# Patient Record
Sex: Female | Born: 1965 | Race: Black or African American | Hispanic: No | Smoking: Former smoker
Health system: Southern US, Community
[De-identification: ages and names within clinical notes are randomized; demographics above are authoritative.]

## PROBLEM LIST (undated history)

## (undated) DIAGNOSIS — K5140 Inflammatory polyps of colon without complications: Secondary | ICD-10-CM

## (undated) DIAGNOSIS — K449 Diaphragmatic hernia without obstruction or gangrene: Secondary | ICD-10-CM

## (undated) DIAGNOSIS — J189 Pneumonia, unspecified organism: Secondary | ICD-10-CM

## (undated) DIAGNOSIS — N390 Urinary tract infection, site not specified: Secondary | ICD-10-CM

## (undated) DIAGNOSIS — M1990 Unspecified osteoarthritis, unspecified site: Secondary | ICD-10-CM

## (undated) DIAGNOSIS — R51 Headache: Secondary | ICD-10-CM

## (undated) DIAGNOSIS — F329 Major depressive disorder, single episode, unspecified: Secondary | ICD-10-CM

## (undated) DIAGNOSIS — K219 Gastro-esophageal reflux disease without esophagitis: Secondary | ICD-10-CM

## (undated) DIAGNOSIS — G4730 Sleep apnea, unspecified: Secondary | ICD-10-CM

## (undated) DIAGNOSIS — K222 Esophageal obstruction: Secondary | ICD-10-CM

## (undated) DIAGNOSIS — F32A Depression, unspecified: Secondary | ICD-10-CM

## (undated) DIAGNOSIS — F419 Anxiety disorder, unspecified: Secondary | ICD-10-CM

## (undated) DIAGNOSIS — I10 Essential (primary) hypertension: Secondary | ICD-10-CM

## (undated) HISTORY — PX: WISDOM TOOTH EXTRACTION: SHX21

## (undated) HISTORY — PX: GASTRIC BYPASS: SHX52

## (undated) HISTORY — PX: LAPAROSCOPY: SHX197

## (undated) HISTORY — DX: Unspecified osteoarthritis, unspecified site: M19.90

## (undated) HISTORY — PX: OVARIAN CYST REMOVAL: SHX89

## (undated) HISTORY — PX: TONSILLECTOMY: SUR1361

## (undated) HISTORY — PX: OTHER SURGICAL HISTORY: SHX169

## (undated) HISTORY — PX: KNEE SURGERY: SHX244

## (undated) HISTORY — PX: CHOLECYSTECTOMY: SHX55

## (undated) HISTORY — PX: APPENDECTOMY: SHX54

## (undated) HISTORY — PX: ECTOPIC PREGNANCY SURGERY: SHX613

---

## 2002-12-06 ENCOUNTER — Inpatient Hospital Stay (HOSPITAL_COMMUNITY): Admission: AD | Admit: 2002-12-06 | Discharge: 2002-12-06 | Payer: Self-pay | Admitting: Family Medicine

## 2002-12-14 ENCOUNTER — Inpatient Hospital Stay (HOSPITAL_COMMUNITY): Admission: AD | Admit: 2002-12-14 | Discharge: 2002-12-14 | Payer: Self-pay | Admitting: *Deleted

## 2002-12-18 ENCOUNTER — Encounter: Payer: Self-pay | Admitting: *Deleted

## 2002-12-18 ENCOUNTER — Inpatient Hospital Stay (HOSPITAL_COMMUNITY): Admission: AD | Admit: 2002-12-18 | Discharge: 2002-12-18 | Payer: Self-pay | Admitting: *Deleted

## 2002-12-21 ENCOUNTER — Inpatient Hospital Stay (HOSPITAL_COMMUNITY): Admission: AD | Admit: 2002-12-21 | Discharge: 2002-12-23 | Payer: Self-pay | Admitting: *Deleted

## 2010-07-26 ENCOUNTER — Emergency Department (HOSPITAL_COMMUNITY): Admission: EM | Admit: 2010-07-26 | Discharge: 2010-07-26 | Payer: Self-pay | Admitting: Emergency Medicine

## 2010-09-28 ENCOUNTER — Emergency Department (HOSPITAL_COMMUNITY)
Admission: EM | Admit: 2010-09-28 | Discharge: 2010-09-28 | Payer: Self-pay | Source: Home / Self Care | Admitting: Emergency Medicine

## 2010-09-28 LAB — CBC
HCT: 41.4 % (ref 36.0–46.0)
Hemoglobin: 13.6 g/dL (ref 12.0–15.0)
MCH: 29.9 pg (ref 26.0–34.0)
MCHC: 32.9 g/dL (ref 30.0–36.0)
MCV: 91 fL (ref 78.0–100.0)
Platelets: 387 10*3/uL (ref 150–400)
RBC: 4.55 MIL/uL (ref 3.87–5.11)
RDW: 13.1 % (ref 11.5–15.5)
WBC: 5.5 10*3/uL (ref 4.0–10.5)

## 2010-09-28 LAB — COMPREHENSIVE METABOLIC PANEL
ALT: 27 U/L (ref 0–35)
AST: 30 U/L (ref 0–37)
Albumin: 4.3 g/dL (ref 3.5–5.2)
Alkaline Phosphatase: 71 U/L (ref 39–117)
BUN: 15 mg/dL (ref 6–23)
CO2: 28 mEq/L (ref 19–32)
Calcium: 9.5 mg/dL (ref 8.4–10.5)
Chloride: 97 mEq/L (ref 96–112)
Creatinine, Ser: 1.33 mg/dL — ABNORMAL HIGH (ref 0.4–1.2)
GFR calc Af Amer: 53 mL/min — ABNORMAL LOW (ref >60)
GFR calc non Af Amer: 44 mL/min — ABNORMAL LOW (ref >60)
Glucose, Bld: 96 mg/dL (ref 70–99)
Potassium: 4 mEq/L (ref 3.5–5.1)
Sodium: 135 mEq/L (ref 135–145)
Total Bilirubin: 0.3 mg/dL (ref 0.3–1.2)
Total Protein: 8.4 g/dL — ABNORMAL HIGH (ref 6.0–8.3)

## 2010-09-28 LAB — RPR: RPR Ser Ql: NONREACTIVE

## 2010-09-28 LAB — URINALYSIS, ROUTINE W REFLEX MICROSCOPIC
Bilirubin Urine: NEGATIVE
Ketones, ur: NEGATIVE mg/dL
Leukocytes, UA: NEGATIVE
Nitrite: NEGATIVE
Protein, ur: NEGATIVE mg/dL
Specific Gravity, Urine: 1.017 (ref 1.005–1.030)
Urine Glucose, Fasting: NEGATIVE mg/dL
Urobilinogen, UA: 0.2 mg/dL (ref 0.0–1.0)
pH: 6 (ref 5.0–8.0)

## 2010-09-28 LAB — WET PREP, GENITAL: Yeast Wet Prep HPF POC: NONE SEEN

## 2010-09-28 LAB — URINE MICROSCOPIC-ADD ON

## 2010-09-28 LAB — POCT PREGNANCY, URINE: Preg Test, Ur: NEGATIVE

## 2010-09-28 LAB — LIPASE, BLOOD: Lipase: 28 U/L (ref 11–59)

## 2010-09-29 LAB — GC/CHLAMYDIA PROBE AMP, GENITAL
Chlamydia, DNA Probe: NEGATIVE
GC Probe Amp, Genital: NEGATIVE

## 2010-10-05 ENCOUNTER — Encounter: Payer: Self-pay | Admitting: Obstetrics and Gynecology

## 2010-10-05 ENCOUNTER — Other Ambulatory Visit: Payer: Self-pay | Admitting: Obstetrics and Gynecology

## 2010-10-05 DIAGNOSIS — N949 Unspecified condition associated with female genital organs and menstrual cycle: Secondary | ICD-10-CM

## 2010-10-05 DIAGNOSIS — K469 Unspecified abdominal hernia without obstruction or gangrene: Secondary | ICD-10-CM

## 2010-10-05 DIAGNOSIS — N898 Other specified noninflammatory disorders of vagina: Secondary | ICD-10-CM

## 2010-10-05 LAB — CONVERTED CEMR LAB
Trich, Wet Prep: NONE SEEN
Yeast Wet Prep HPF POC: NONE SEEN

## 2010-10-10 ENCOUNTER — Inpatient Hospital Stay (HOSPITAL_COMMUNITY)
Admission: RE | Admit: 2010-10-10 | Discharge: 2010-10-10 | Payer: Self-pay | Source: Ambulatory Visit | Attending: Obstetrics and Gynecology | Admitting: Obstetrics and Gynecology

## 2010-10-13 ENCOUNTER — Ambulatory Visit (HOSPITAL_COMMUNITY)
Admission: RE | Admit: 2010-10-13 | Discharge: 2010-10-13 | Disposition: A | Discharge status: Home / Self Care | Payer: Medicaid Other | Source: Ambulatory Visit | Attending: Obstetrics and Gynecology | Admitting: Obstetrics and Gynecology

## 2010-10-13 DIAGNOSIS — R1031 Right lower quadrant pain: Secondary | ICD-10-CM | POA: Insufficient documentation

## 2010-10-13 DIAGNOSIS — K5730 Diverticulosis of large intestine without perforation or abscess without bleeding: Secondary | ICD-10-CM | POA: Insufficient documentation

## 2010-10-13 DIAGNOSIS — D259 Leiomyoma of uterus, unspecified: Secondary | ICD-10-CM | POA: Insufficient documentation

## 2010-10-13 DIAGNOSIS — K469 Unspecified abdominal hernia without obstruction or gangrene: Secondary | ICD-10-CM

## 2010-10-13 DIAGNOSIS — Q619 Cystic kidney disease, unspecified: Secondary | ICD-10-CM | POA: Insufficient documentation

## 2010-10-13 MED ORDER — IOHEXOL 300 MG/ML  SOLN
100.0000 mL | Freq: Once | INTRAMUSCULAR | Status: AC | PRN
  Administered 2010-10-13: 100 mL via INTRAVENOUS

## 2010-10-21 ENCOUNTER — Other Ambulatory Visit: Payer: Self-pay | Admitting: Obstetrics & Gynecology

## 2010-10-21 ENCOUNTER — Ambulatory Visit (INDEPENDENT_AMBULATORY_CARE_PROVIDER_SITE_OTHER): Payer: Medicaid Other | Admitting: Obstetrics & Gynecology

## 2010-10-21 DIAGNOSIS — N949 Unspecified condition associated with female genital organs and menstrual cycle: Secondary | ICD-10-CM

## 2010-10-21 DIAGNOSIS — Z1231 Encounter for screening mammogram for malignant neoplasm of breast: Secondary | ICD-10-CM

## 2010-10-21 DIAGNOSIS — R1084 Generalized abdominal pain: Secondary | ICD-10-CM

## 2010-10-21 DIAGNOSIS — R1031 Right lower quadrant pain: Secondary | ICD-10-CM

## 2010-10-21 DIAGNOSIS — R102 Pelvic and perineal pain: Secondary | ICD-10-CM

## 2010-10-22 NOTE — Progress Notes (Signed)
NAME:  PRICELLA, Bailey Russell NO.:  [id number]  MEDICAL RECORD NO.:  [id number]           PATIENT TYPE:  A  LOCATION:  [facility]                   FACILITY:  [facility]  PHYSICIAN:  Allie Bossier, MD        DATE OF BIRTH:  [birth year]  DATE OF SERVICE:  10/21/2010                                 CLINIC NOTE  [name] is a 45 year old widowed black G4, P4.  She was seen in the MAU on September 28, 2010, complaining of right lower quadrant pain.  She describes it as mild-to-moderate.  She denied associated constipation, diarrhea, flank pain, hematemesis, hematuria, etc.  Please note visit at that time, she was noted to have trichomoniasis and was treated.  She followed up in the [facility]YN Clinic and was seen here by Dr. Okey Dupre on October 05, 2010.  At that time, he ordered the CT to see if there is any other cause of her pain that can be seen on CT.  The CT was essentially normal and did show the same fibroid that has been present for years (fibroid is posterior and 6 cm).  PAST MEDICAL HISTORY:  Morbid obesity, migraines, hypertension, depression, anxiety, recurrent Trichomonas and BV.  REVIEW OF SYSTEMS:  She says her periods are monthly, they last 4-6 days per month.  They are rarely painful.  She has been abstinent since Russell 2011 (please note her Trichomonas keeps coming back positive in spite of treatment).  Her Pap smear was negative in March 2011, and she has not yet had a mammogram.  PAST SURGICAL HISTORY:  She has had a laparoscopy for lysis of adhesions.  Prior to that, she had an exploratory laparotomy for right ectopic pregnancy and a portion of her right tube was removed.  Other surgeries include cholecystectomy, appendectomy, right ovarian cystectomy.  FAMILY HISTORY:  Negative for breast, GYN, and colon malignancies.  SOCIAL HISTORY:  Negative for tobacco, alcohol, or drug use.  No latex allergies.  No known drug allergies.  MEDICATIONS: 1. Topamax  daily. 2. Phentermine for weight loss daily. 3. Hydrochlorothiazide daily. 4. Diclofenac b.i.d. 5. Citalopram daily. 6. Clonazepam on a p.r.n. basis. 7. Tylenol PM on a p.r.n. basis. 8. She is currently taking the metronidazole for BV.  PHYSICAL EXAMINATION:  GENERAL:  Well-nourished, morbidly obese black female. VITAL SIGNS:  Height 5 feet 7 inches, weight 294 pounds, blood pressure 110/78, pulse 87. HEART:  Regular rate rhythm. LUNGS:  Clear to auscultation bilaterally. ABDOMEN:  Obese, benign. GU:  External genitalia, no lesions.  Bimanual, she has reasonably good pubic arch.  She has a 6-8 week size anteverted mobile uterus, it is nontender.  I do not feel any adnexal masses.  ASSESSMENT AND PLAN:  Right lower quadrant pain for several months' duration.  I suggested watchful waiting, but she is adamant that the pain is so disruptive to her life that she would like another surgery. I have explained the surgeries cause adhesions or can be a cause of adhesions and their risks associated with surgery including but not limited to infection, bleeding, damage to bowel, bladder, and ureters. Even with knowing and accepting these risks, she would like a laparoscopy.  I have offered her a laparoscopy to determine if there is a GYN cause of her pain.  Please note she is trying to get a colonoscopy set up and I have recommend she get a mammogram scheduled.     Allie Bossier, MD    MCD/MEDQ  D:  10/21/2010  T:  10/22/2010  Job:  [id number]

## 2010-10-28 ENCOUNTER — Ambulatory Visit (HOSPITAL_COMMUNITY): Payer: Medicaid Other

## 2010-10-29 ENCOUNTER — Emergency Department (HOSPITAL_COMMUNITY)
Admission: EM | Admit: 2010-10-29 | Discharge: 2010-10-29 | Disposition: A | Discharge status: Home / Self Care | Payer: Medicaid Other | Attending: Emergency Medicine | Admitting: Emergency Medicine

## 2010-10-29 DIAGNOSIS — IMO0002 Reserved for concepts with insufficient information to code with codable children: Secondary | ICD-10-CM | POA: Insufficient documentation

## 2010-10-29 DIAGNOSIS — Y92410 Unspecified street and highway as the place of occurrence of the external cause: Secondary | ICD-10-CM | POA: Insufficient documentation

## 2010-10-29 DIAGNOSIS — I10 Essential (primary) hypertension: Secondary | ICD-10-CM | POA: Insufficient documentation

## 2010-10-29 DIAGNOSIS — S139XXA Sprain of joints and ligaments of unspecified parts of neck, initial encounter: Secondary | ICD-10-CM | POA: Insufficient documentation

## 2010-10-31 ENCOUNTER — Encounter (HOSPITAL_COMMUNITY)
Admission: RE | Admit: 2010-10-31 | Discharge: 2010-10-31 | Disposition: A | Discharge status: Home / Self Care | Payer: Medicaid Other | Source: Ambulatory Visit | Attending: Obstetrics & Gynecology | Admitting: Obstetrics & Gynecology

## 2010-10-31 DIAGNOSIS — Z01818 Encounter for other preprocedural examination: Secondary | ICD-10-CM | POA: Insufficient documentation

## 2010-10-31 DIAGNOSIS — Z01812 Encounter for preprocedural laboratory examination: Secondary | ICD-10-CM | POA: Insufficient documentation

## 2010-10-31 LAB — BASIC METABOLIC PANEL
BUN: 10 mg/dL (ref 6–23)
CO2: 29 mEq/L (ref 19–32)
Calcium: 9.5 mg/dL (ref 8.4–10.5)
Chloride: 103 mEq/L (ref 96–112)
Creatinine, Ser: 0.85 mg/dL (ref 0.4–1.2)
GFR calc Af Amer: >60 mL/min (ref >60)
GFR calc non Af Amer: >60 mL/min (ref >60)
Glucose, Bld: 86 mg/dL (ref 70–99)
Potassium: 3.3 mEq/L — ABNORMAL LOW (ref 3.5–5.1)
Sodium: 138 mEq/L (ref 135–145)

## 2010-10-31 LAB — CBC
HCT: 38.7 % (ref 36.0–46.0)
Hemoglobin: 12.4 g/dL (ref 12.0–15.0)
MCH: 30 pg (ref 26.0–34.0)
MCHC: 32 g/dL (ref 30.0–36.0)
MCV: 93.5 fL (ref 78.0–100.0)
Platelets: 338 10*3/uL (ref 150–400)
RBC: 4.14 MIL/uL (ref 3.87–5.11)
RDW: 13.7 % (ref 11.5–15.5)
WBC: 7.9 10*3/uL (ref 4.0–10.5)

## 2010-10-31 LAB — SURGICAL PCR SCREEN
MRSA, PCR: NEGATIVE
Staphylococcus aureus: NEGATIVE

## 2010-11-01 ENCOUNTER — Ambulatory Visit (HOSPITAL_COMMUNITY): Payer: Medicaid Other | Attending: Obstetrics & Gynecology

## 2010-11-03 ENCOUNTER — Ambulatory Visit (HOSPITAL_COMMUNITY)
Admission: RE | Admit: 2010-11-03 | Discharge: 2010-11-03 | Disposition: A | Discharge status: Home / Self Care | Payer: Medicaid Other | Source: Ambulatory Visit | Attending: Obstetrics & Gynecology | Admitting: Obstetrics & Gynecology

## 2010-11-03 DIAGNOSIS — R1031 Right lower quadrant pain: Secondary | ICD-10-CM

## 2010-11-03 DIAGNOSIS — Z01812 Encounter for preprocedural laboratory examination: Secondary | ICD-10-CM | POA: Insufficient documentation

## 2010-11-03 LAB — PREGNANCY, URINE: Preg Test, Ur: NEGATIVE

## 2010-11-15 LAB — CBC
HCT: 39.3 % (ref 36.0–46.0)
Hemoglobin: 13 g/dL (ref 12.0–15.0)
MCH: 30.7 pg (ref 26.0–34.0)
MCHC: 33.1 g/dL (ref 30.0–36.0)
MCV: 92.9 fL (ref 78.0–100.0)
Platelets: 330 10*3/uL (ref 150–400)
RBC: 4.23 MIL/uL (ref 3.87–5.11)
RDW: 13.4 % (ref 11.5–15.5)
WBC: 7.9 10*3/uL (ref 4.0–10.5)

## 2010-11-15 LAB — POCT PREGNANCY, URINE: Preg Test, Ur: NEGATIVE

## 2010-11-15 LAB — URINALYSIS, ROUTINE W REFLEX MICROSCOPIC
Bilirubin Urine: NEGATIVE
Glucose, UA: NEGATIVE mg/dL
Ketones, ur: NEGATIVE mg/dL
Nitrite: NEGATIVE
Protein, ur: NEGATIVE mg/dL
Specific Gravity, Urine: 1.022 (ref 1.005–1.030)
Urobilinogen, UA: 0.2 mg/dL (ref 0.0–1.0)
pH: 5.5 (ref 5.0–8.0)

## 2010-11-15 LAB — DIFFERENTIAL
Basophils Absolute: 0 10*3/uL (ref 0.0–0.1)
Basophils Relative: 1 % (ref 0–1)
Eosinophils Absolute: 0.1 10*3/uL (ref 0.0–0.7)
Eosinophils Relative: 1 % (ref 0–5)
Lymphocytes Relative: 42 % (ref 12–46)
Lymphs Abs: 3.3 10*3/uL (ref 0.7–4.0)
Monocytes Absolute: 0.5 10*3/uL (ref 0.1–1.0)
Monocytes Relative: 7 % (ref 3–12)
Neutro Abs: 3.9 10*3/uL (ref 1.7–7.7)
Neutrophils Relative %: 50 % (ref 43–77)

## 2010-11-15 LAB — COMPREHENSIVE METABOLIC PANEL
ALT: 20 U/L (ref 0–35)
AST: 26 U/L (ref 0–37)
Albumin: 3.8 g/dL (ref 3.5–5.2)
Alkaline Phosphatase: 61 U/L (ref 39–117)
BUN: 4 mg/dL — ABNORMAL LOW (ref 6–23)
CO2: 31 mEq/L (ref 19–32)
Calcium: 9 mg/dL (ref 8.4–10.5)
Chloride: 103 mEq/L (ref 96–112)
Creatinine, Ser: 0.82 mg/dL (ref 0.4–1.2)
GFR calc Af Amer: >60 mL/min (ref >60)
GFR calc non Af Amer: >60 mL/min (ref >60)
Glucose, Bld: 106 mg/dL — ABNORMAL HIGH (ref 70–99)
Potassium: 3.5 mEq/L (ref 3.5–5.1)
Sodium: 139 mEq/L (ref 135–145)
Total Bilirubin: 0.3 mg/dL (ref 0.3–1.2)
Total Protein: 7.8 g/dL (ref 6.0–8.3)

## 2010-11-15 LAB — URINE MICROSCOPIC-ADD ON

## 2010-11-15 LAB — LIPASE, BLOOD: Lipase: 28 U/L (ref 11–59)

## 2010-11-16 ENCOUNTER — Other Ambulatory Visit (HOSPITAL_COMMUNITY): Payer: Self-pay | Admitting: Chiropractic Medicine

## 2010-11-16 ENCOUNTER — Ambulatory Visit (HOSPITAL_COMMUNITY)
Admission: RE | Admit: 2010-11-16 | Discharge: 2010-11-16 | Disposition: A | Discharge status: Home / Self Care | Payer: Medicaid Other | Source: Ambulatory Visit | Attending: Chiropractic Medicine | Admitting: Chiropractic Medicine

## 2010-11-16 DIAGNOSIS — M542 Cervicalgia: Secondary | ICD-10-CM | POA: Insufficient documentation

## 2010-11-16 DIAGNOSIS — M948X9 Other specified disorders of cartilage, unspecified sites: Secondary | ICD-10-CM | POA: Insufficient documentation

## 2010-11-18 ENCOUNTER — Ambulatory Visit (HOSPITAL_COMMUNITY)
Admission: RE | Admit: 2010-11-18 | Discharge: 2010-11-18 | Disposition: A | Discharge status: Home / Self Care | Payer: Medicaid Other | Source: Ambulatory Visit | Attending: Obstetrics & Gynecology | Admitting: Obstetrics & Gynecology

## 2010-11-18 DIAGNOSIS — Z1231 Encounter for screening mammogram for malignant neoplasm of breast: Secondary | ICD-10-CM

## 2010-11-25 NOTE — Progress Notes (Unsigned)
NAME:  Bailey Russell, LABELL NO.:  [id number]  MEDICAL RECORD NO.:  [id number]           PATIENT TYPE:  A  LOCATION:  [facility]                   FACILITY:  [facility]  PHYSICIAN:  Argentina Donovan, MD        DATE OF BIRTH:  [birth year]  DATE OF SERVICE:  10/05/2010                                 CLINIC NOTE  The patient is a 45 year old African American female gravida 4, para 4-0- 0-4, eldest child is 80 and the youngest is 7.  She weighs 295 pounds and has had history of gall bladder removal, appendectomy, right ovarian cystectomy.  She had an ultrasound in the emergency room when she went for pelvic pain that showed she had a uterus that measured 10 cm with about a 6 cm posterior fibroid and several smaller ones inside.  She has had them for a year she states.  In the last few months she developed a sharp lower right quadrant pain that is persistent.  She has episodes where it increases and the ebbs but never goes away and at those two 2 times, she did go into the emergency room.  At one time she had the first emergency room visit in November, they told she had Trichomonas, treated with metronidazole and then in December, she went in and she had not had sex during that period and yet she turned up to be positive for Trichomonas and BV and once again treated for 1 week of metronidazole. She has no GI symptoms that would explain the pain.  On examination, her abdomen is soft, flat, very tender in the area of her previous appendectomy scar, but the pain is relatively superficial. I cannot demonstrate a hernia nor could I feel a firm nodule that may tell me she has a neuroma.  I am going to see with a CT if we can picture anything there and I will also at that point when she comes back, refer her to a general surgeon.  In the meantime, we are going to have her schedule a colonoscopy, although I do not think this is bowel or gynecological.  IMPRESSION:  Right lower quadrant pain,  possible neuroma, possible occult hernia.  I am also going to give the patient a prescription for metronidazole for 14 days should the report come back stating that she still has the infection.          ______________________________ Argentina Donovan, MD    PR/MEDQ  D:  10/05/2010  T:  10/06/2010  Job:  [id number]

## 2010-11-27 ENCOUNTER — Emergency Department (HOSPITAL_COMMUNITY)
Admission: EM | Admit: 2010-11-27 | Discharge: 2010-11-27 | Disposition: A | Discharge status: Home / Self Care | Payer: Medicaid Other | Attending: Emergency Medicine | Admitting: Emergency Medicine

## 2010-11-27 ENCOUNTER — Emergency Department (HOSPITAL_COMMUNITY): Payer: Medicaid Other

## 2010-11-27 DIAGNOSIS — M79609 Pain in unspecified limb: Secondary | ICD-10-CM | POA: Insufficient documentation

## 2010-11-27 LAB — CBC
HCT: 35.6 % — ABNORMAL LOW (ref 36.0–46.0)
Hemoglobin: 11.6 g/dL — ABNORMAL LOW (ref 12.0–15.0)
MCH: 30.4 pg (ref 26.0–34.0)
MCHC: 32.6 g/dL (ref 30.0–36.0)
MCV: 93.2 fL (ref 78.0–100.0)
Platelets: 329 10*3/uL (ref 150–400)
RBC: 3.82 MIL/uL — ABNORMAL LOW (ref 3.87–5.11)
RDW: 13.8 % (ref 11.5–15.5)
WBC: 8.1 10*3/uL (ref 4.0–10.5)

## 2010-12-02 NOTE — Op Note (Signed)
  NAME:  Bailey Russell, Bailey Russell       ACCOUNT NO.:  [id number]  MEDICAL RECORD NO.:  [id number]           PATIENT TYPE:  O  LOCATION:  [facility]                          [facility]  PHYSICIAN:  Allie Bossier, MD        DATE OF BIRTH:  [birth year]  DATE OF PROCEDURE:  11/03/2010 DATE OF DISCHARGE:                              OPERATIVE REPORT   PREOPERATIVE DIAGNOSIS:  Chronic right lower quadrant pain.  POSTOPERATIVE DIAGNOSIS:  Chronic right lower quadrant pain.  PROCEDURE:  Diagnostic laparoscopy.  SURGEON:  Allie Bossier, MD  ANESTHESIA:  Lyna Poser.  COMPLICATIONS:  None.  ESTIMATED BLOOD LOSS:  Minimal.  SPECIMENS:  None.  FINDINGS:  No obvious etiology to explain her right lower quadrant pain; however, as we knew before there was a large fibroid.  DETAILS OF PROCEDURE AND FINDINGS:  The risks, benefits, and alternatives of surgery were explained, understood, accepted.  Consents were signed.  The patient was taken to the operating room.  General anesthesia was applied without complication.  She was placed in dorsal lithotomy position.  Her abdomen and vagina were prepped and draped in usual sterile fashion.  Her bladder was emptied with the [facility] catheter.  A thorough bimanual exam revealed a globular 6- to 8- week size uterus.  She has a remarkably wide pubic arch and anatomy that would allow for vaginal hysterectomy.  Her adnexa were not palpable on exam.  Hulka manipulator was placed on the cervix.  Gloves were changed. Attention was turned to the abdomen.  A vertical umbilical incision was made.  A Veress needle was placed intraperitoneally.  Low-flow CO2 was used to insufflate the abdomen approximately 4 L.  After pneumoperitoneum was established, an 11-mm trocar was placed. Laparoscopy confirmed correct placement.  She was placed in Trendelenburg position and a 5-mm port was placed in the left lower quadrant under direct laparoscopic visualization, taking care  to avoid the inferior epigastric vessels.  Please note that I injected 0.5% Marcaine in the subcutaneous tissue at the umbilicus and then the left lower quadrant prior to making any incision.  Laparoscopy at the pelvis was inspected in an alpha manner.  The upper abdomen appeared normal. The right ovary was free and clear of adhesions.  The left ovary appeared normal.  The anterior cul-de-sac was normal.  The uterus itself had some evidence of probable old PID.  There were no adhesions to speak of.  The 5-mm port was removed. No bleeding was noted.  The 10-mm was removed after allowing the CO2 to escape.  The fascia was closed at the umbilicus with figure-of-eight 0- Vicryl suture.  Subcuticular closure was done at both sites with a 4-0 Vicryl suture.  The tenaculum was removed.  Instrument, sponge, and needle counts were correct.  She tolerated the procedure well.     Allie Bossier, MD     MCD/MEDQ  D:  11/03/2010  T:  11/04/2010  Job:  [id number]  Electronically Signed by Nicholaus Bloom MD on 12/02/2010 11:29:21 AM

## 2010-12-05 ENCOUNTER — Ambulatory Visit: Payer: Medicaid Other | Admitting: Obstetrics & Gynecology

## 2011-04-26 ENCOUNTER — Other Ambulatory Visit: Payer: Self-pay | Admitting: Specialist

## 2011-04-26 ENCOUNTER — Ambulatory Visit
Admission: RE | Admit: 2011-04-26 | Discharge: 2011-04-26 | Disposition: A | Discharge status: Home / Self Care | Payer: Medicaid Other | Source: Ambulatory Visit | Attending: Specialist | Admitting: Specialist

## 2011-04-26 DIAGNOSIS — R200 Anesthesia of skin: Secondary | ICD-10-CM

## 2011-04-26 DIAGNOSIS — R202 Paresthesia of skin: Secondary | ICD-10-CM

## 2011-08-16 ENCOUNTER — Encounter (HOSPITAL_COMMUNITY): Payer: Self-pay | Admitting: *Deleted

## 2011-08-16 ENCOUNTER — Inpatient Hospital Stay (HOSPITAL_COMMUNITY): Payer: Medicaid Other

## 2011-08-16 ENCOUNTER — Inpatient Hospital Stay (HOSPITAL_COMMUNITY)
Admission: AD | Admit: 2011-08-16 | Discharge: 2011-08-16 | Disposition: A | Discharge status: Home / Self Care | Payer: Medicaid Other | Source: Ambulatory Visit | Attending: Obstetrics and Gynecology | Admitting: Obstetrics and Gynecology

## 2011-08-16 DIAGNOSIS — R109 Unspecified abdominal pain: Secondary | ICD-10-CM

## 2011-08-16 DIAGNOSIS — N949 Unspecified condition associated with female genital organs and menstrual cycle: Secondary | ICD-10-CM | POA: Insufficient documentation

## 2011-08-16 HISTORY — DX: Gastro-esophageal reflux disease without esophagitis: K21.9

## 2011-08-16 HISTORY — DX: Inflammatory polyps of colon without complications: K51.40

## 2011-08-16 HISTORY — DX: Esophageal obstruction: K22.2

## 2011-08-16 HISTORY — DX: Headache: R51

## 2011-08-16 HISTORY — DX: Major depressive disorder, single episode, unspecified: F32.9

## 2011-08-16 HISTORY — DX: Depression, unspecified: F32.A

## 2011-08-16 HISTORY — DX: Essential (primary) hypertension: I10

## 2011-08-16 HISTORY — DX: Anxiety disorder, unspecified: F41.9

## 2011-08-16 HISTORY — DX: Diaphragmatic hernia without obstruction or gangrene: K44.9

## 2011-08-16 HISTORY — DX: Urinary tract infection, site not specified: N39.0

## 2011-08-16 LAB — DIFFERENTIAL
Basophils Absolute: 0 10*3/uL (ref 0.0–0.1)
Basophils Relative: 1 % (ref 0–1)
Eosinophils Absolute: 0.1 10*3/uL (ref 0.0–0.7)
Eosinophils Relative: 1 % (ref 0–5)
Lymphocytes Relative: 42 % (ref 12–46)
Lymphs Abs: 3.3 10*3/uL (ref 0.7–4.0)
Monocytes Absolute: 0.5 10*3/uL (ref 0.1–1.0)
Monocytes Relative: 6 % (ref 3–12)
Neutro Abs: 4 10*3/uL (ref 1.7–7.7)
Neutrophils Relative %: 50 % (ref 43–77)

## 2011-08-16 LAB — CBC
HCT: 40.8 % (ref 36.0–46.0)
Hemoglobin: 13.4 g/dL (ref 12.0–15.0)
MCH: 30.5 pg (ref 26.0–34.0)
MCHC: 32.8 g/dL (ref 30.0–36.0)
MCV: 92.7 fL (ref 78.0–100.0)
Platelets: 354 10*3/uL (ref 150–400)
RBC: 4.4 MIL/uL (ref 3.87–5.11)
RDW: 13.3 % (ref 11.5–15.5)
WBC: 8.1 10*3/uL (ref 4.0–10.5)

## 2011-08-16 LAB — URINALYSIS, ROUTINE W REFLEX MICROSCOPIC
Bilirubin Urine: NEGATIVE
Glucose, UA: NEGATIVE mg/dL
Ketones, ur: NEGATIVE mg/dL
Leukocytes, UA: NEGATIVE
Nitrite: NEGATIVE
Protein, ur: NEGATIVE mg/dL
Specific Gravity, Urine: >1.03 — ABNORMAL HIGH (ref 1.005–1.030)
Urobilinogen, UA: 0.2 mg/dL (ref 0.0–1.0)
pH: 5.5 (ref 5.0–8.0)

## 2011-08-16 LAB — POCT PREGNANCY, URINE: Preg Test, Ur: NEGATIVE

## 2011-08-16 LAB — URINE MICROSCOPIC-ADD ON

## 2011-08-16 LAB — WET PREP, GENITAL
Clue Cells Wet Prep HPF POC: NONE SEEN
Trich, Wet Prep: NONE SEEN
Yeast Wet Prep HPF POC: NONE SEEN

## 2011-08-16 MED ORDER — KETOROLAC TROMETHAMINE 60 MG/2ML IM SOLN
60.0000 mg | Freq: Once | INTRAMUSCULAR | Status: AC
  Administered 2011-08-16: 60 mg via INTRAMUSCULAR
  Filled 2011-08-16: qty 2

## 2011-08-16 MED ORDER — OXYCODONE-ACETAMINOPHEN 5-325 MG PO TABS
1.0000 | ORAL_TABLET | Freq: Once | ORAL | Status: AC
  Administered 2011-08-16: 1 via ORAL
  Filled 2011-08-16: qty 1

## 2011-08-16 NOTE — ED Notes (Signed)
Has appointment in clinic 12/26 @ 1330. Was told to come to MAU if the pain became worse.

## 2011-08-16 NOTE — ED Notes (Signed)
Took 2 hydrocodone-acetaminophen yesterday.

## 2011-08-16 NOTE — Progress Notes (Signed)
Pt states feels vaginal pressure, last menses in September. Started feeling sharp right sided lower abdominal pain yesterday, feels very tight, has let up some at present, hx ovarian cysts in 2008. Denies uti s/s.

## 2011-08-16 NOTE — ED Provider Notes (Signed)
History     No chief complaint on file.  HPITracy Magdalen Russell y.o. patient of Dr. Serita Kyle, has appt 12/26 in [facility] to evaluate pelvic pain. G6 P4024. Mid-November began with pain , progressively worsened but yesterday much worse " just hit", only on the right.  She has had pain in that area in past and reports she saw movement.   Hx of Cholecystectomy,appendectomy and part of her right tube removed with ectopic pregnancy.  Hx of right ovarian cyst.   "ready to take everything out.  Has not taken anything for pain today.   She states she has diverticulosis.   Past Medical History  Diagnosis Date  . Hypertension   . Headache     migraines  . Anxiety   . Depression   . Urinary tract infection   . Ectopic pregnancy   . Inflammatory polyps of colon   . Acid reflux   . Esophageal stricture   . Hiatal hernia     Past Surgical History  Procedure Date  . Ectopic pregnancy surgery   . Tonsillectomy   . Cholecystectomy   . Wisdom tooth extraction   . Ovarian cyst removal   . Mass removed from r buttock   . Laparoscopy     X 2 to remove scar tissue  . Appendectomy   . Polypectomy     polyps removed from colon during colonoscopy    Family History  Problem Relation Age of Onset  . Anesthesia problems Neg Hx     History  Substance Use Topics  . Smoking status: Current Everyday Smoker -- 0.2 packs/day  . Smokeless tobacco: Never Used  . Alcohol Use: No    Allergies: No Known Allergies  Prescriptions prior to admission  Medication Sig Dispense Refill  . diphenhydramine-acetaminophen (TYLENOL PM) 25-500 MG TABS Take 3 tablets by mouth at bedtime as needed. For pain/sleep       . hydrochlorothiazide (HYDRODIURIL) 25 MG tablet Take 25 mg by mouth daily.        Marland Kitchen HYDROcodone-acetaminophen (NORCO) 5-325 MG per tablet Take 2 tablets by mouth every 4 (four) hours as needed. For pain       . zolpidem (AMBIEN) 10 MG tablet Take 10 mg by mouth at bedtime as needed. For sleep          Review of Systems  Constitutional: Negative for fever and chills.  Gastrointestinal: Positive for nausea and abdominal pain. Negative for vomiting.  Genitourinary: Negative.        Neg for vaginal bleeding or discharge   Physical Exam   Blood pressure 114/79, pulse 86, temperature 98.8 F (37.1 C), temperature source Oral, resp. rate 16, height 5' 7.75" (1.721 m), weight 292 lb 2 oz (132.507 kg).  Physical Exam  Constitutional: She is oriented to person, place, and time. She appears well-developed and well-nourished. No distress (uncomfortable).  HENT:  Head: Normocephalic.  Neck: Normal range of motion.  Cardiovascular: Normal rate.   Respiratory: Effort normal.  GI: Soft. She exhibits no distension and no mass. There is tenderness (right lower quadrant). There is no rebound and no guarding.  Genitourinary: Uterus is not enlarged and not tender. Cervix exhibits no motion tenderness, no discharge and no friability. Right adnexum displays tenderness (moderate). Right adnexum displays no mass and no fullness. Left adnexum displays no mass, no tenderness and no fullness. No erythema or bleeding around the vagina. No vaginal discharge found.       Exam limited by patients size  Neurological: She is alert and oriented to person, place, and time.  Skin: Skin is warm and dry.   Results for orders placed during the hospital encounter of 08/16/11 (from the past 24 hour(s))  URINALYSIS, ROUTINE W REFLEX MICROSCOPIC     Status: Abnormal   Collection Time   08/16/11  9:52 AM      Component Value Range   Color, Urine YELLOW  YELLOW    APPearance CLEAR  CLEAR    Specific Gravity, Urine >1.030 (*) 1.005 - 1.030    pH 5.5  5.0 - 8.0    Glucose, UA NEGATIVE  NEGATIVE (mg/dL)   Hgb urine dipstick TRACE (*) NEGATIVE    Bilirubin Urine NEGATIVE  NEGATIVE    Ketones, ur NEGATIVE  NEGATIVE (mg/dL)   Protein, ur NEGATIVE  NEGATIVE (mg/dL)   Urobilinogen, UA 0.2  0.0 - 1.0 (mg/dL)   Nitrite  NEGATIVE  NEGATIVE    Leukocytes, UA NEGATIVE  NEGATIVE   URINE MICROSCOPIC-ADD ON     Status: Abnormal   Collection Time   08/16/11  9:52 AM      Component Value Range   Squamous Epithelial / LPF FEW (*) RARE    WBC, UA 0-2  <3 (WBC/hpf)   Bacteria, UA MANY (*) RARE   POCT PREGNANCY, URINE     Status: Normal   Collection Time   08/16/11 10:02 AM      Component Value Range   Preg Test, Ur NEGATIVE    CBC     Status: Normal   Collection Time   08/16/11 10:40 AM      Component Value Range   WBC 8.1  4.0 - 10.5 (K/uL)   RBC 4.40  3.87 - 5.11 (MIL/uL)   Hemoglobin 13.4  12.0 - 15.0 (g/dL)   HCT 09.8  11.9 - 14.7 (%)   MCV 92.7  78.0 - 100.0 (fL)   MCH 30.5  26.0 - 34.0 (pg)   MCHC 32.8  30.0 - 36.0 (g/dL)   RDW 82.9  56.2 - 13.0 (%)   Platelets 354  150 - 400 (K/uL)  DIFFERENTIAL     Status: Normal   Collection Time   08/16/11 10:40 AM      Component Value Range   Neutrophils Relative 50  43 - 77 (%)   Neutro Abs 4.0  1.7 - 7.7 (K/uL)   Lymphocytes Relative 42  12 - 46 (%)   Lymphs Abs 3.3  0.7 - 4.0 (K/uL)   Monocytes Relative 6  3 - 12 (%)   Monocytes Absolute 0.5  0.1 - 1.0 (K/uL)   Eosinophils Relative 1  0 - 5 (%)   Eosinophils Absolute 0.1  0.0 - 0.7 (K/uL)   Basophils Relative 1  0 - 1 (%)   Basophils Absolute 0.0  0.0 - 0.1 (K/uL)  WET PREP, GENITAL     Status: Abnormal   Collection Time   08/16/11 12:40 PM      Component Value Range   Yeast, Wet Prep NONE SEEN  NONE SEEN    Trich, Wet Prep NONE SEEN  NONE SEEN    Clue Cells, Wet Prep NONE SEEN  NONE SEEN    WBC, Wet Prep HPF POC MODERATE (*) NONE SEEN        *RADIOLOGY REPORT*  Clinical Data: Severe pelvic pain. Fibroids. Previous history of  ovarian cyst.  TRANSABDOMINAL AND TRANSVAGINAL ULTRASOUND OF PELVIS  Technique: Both transabdominal and transvaginal ultrasound  examinations of the pelvis  were performed. Transabdominal  technique was performed for global imaging of the pelvis including  uterus,  ovaries, adnexal regions, and pelvic cul-de-sac.  It was necessary to proceed with endovaginal exam following the  transabdominal exam to visualize the endometrium and ovaries.  Comparison: 09/28/2010  Findings:  Uterus: 11.3 x 6.7 x 7.3 cm. Heterogeneous myometrium noted. The  at least three distinct fibroids are visualized. 01/08 in the left  anterior uterine fundus is hyperechoic measuring 3.5 cm in maximum  diameter. The largest is located in the right posterior fundal  region measuring 6.7 cm in maximum diameter. An intramural fibroid  is seen in the anterior uterine body which measures 2.2 cm in  maximum diameter.  Endometrium: Double layer thickness measures 16 mm. No focal  lesion identified.  Right ovary: 3.4 x 1.7 x 2.1 cm. Normal appearance.  Left ovary: 3.5 x 2.4 x 2.6 cm. Normal appearance.  Other Findings: No free fluid  IMPRESSION:  1. Several uterine fibroids, without significant change compared  to previous study.  2. Normal ovaries. No adnexal mass or free fluid identified.  Original Report Authenticated By: Danae Orleans, M.D.        MAU Course  Procedures  Toradol 60mg  IM ordered for pain. GC/CHl culture to lab Percocet 325/5mg  was given.  Patient is ready for discharge after results explained.  Plans to call Dr. Dulce Sellar for appointment.  He is her GI doctor.  MDM    Patient reports that the Toradol has not helped pain.  She has Percocet at home and it helps with pain.  Ordered Percocet 325/5mg  for pain. Assessment and Plan   A: Abdominal Pain      GYN cause ruled out  P:  Patient plans to call Dr. Dulce Sellar today to make appointment.  He is her GI doctor.   Saphyra Hutt,EVE M 08/16/2011, 11:11 AM   Matt Holmes, NP 08/16/11 1326

## 2011-08-16 NOTE — Discharge Instructions (Signed)

## 2011-08-17 LAB — GC/CHLAMYDIA PROBE AMP, GENITAL
Chlamydia, DNA Probe: NEGATIVE
GC Probe Amp, Genital: NEGATIVE

## 2011-08-20 NOTE — ED Provider Notes (Signed)
Agree with above note.  Bailey Russell 08/20/2011 8:00 AM

## 2011-08-30 ENCOUNTER — Ambulatory Visit: Payer: Medicaid Other | Admitting: Obstetrics and Gynecology

## 2011-11-08 ENCOUNTER — Ambulatory Visit (HOSPITAL_BASED_OUTPATIENT_CLINIC_OR_DEPARTMENT_OTHER): Payer: Medicaid Other | Attending: Specialist | Admitting: Radiology

## 2011-11-08 VITALS — Ht 68.0 in | Wt 300.0 lb

## 2011-11-08 DIAGNOSIS — G4733 Obstructive sleep apnea (adult) (pediatric): Secondary | ICD-10-CM

## 2011-11-12 DIAGNOSIS — G4733 Obstructive sleep apnea (adult) (pediatric): Secondary | ICD-10-CM

## 2011-11-13 NOTE — Procedures (Signed)
NAME:  AMMI, HUTT NO.:  [id number]  MEDICAL RECORD NO.:  [id number]          PATIENT TYPE:  OUT  LOCATION:  S[facility] CENTER                 FACILITY:  [facility]  PHYSICIAN:  Rye Decoste D. Maple Hudson, MD, FCCP, FACPDATE OF BIRTH:  08-Mar-1967  DATE OF STUDY:  11/08/2011                           NOCTURNAL POLYSOMNOGRAM  REFERRING PHYSICIAN:  DWIGHT M WILLIAMS  INDICATION FOR STUDY:  Hypersomnia with sleep apnea.  EPWORTH SLEEPINESS SCORE:  3/24.  BMI 45.6, weight 300 pounds, height 68 inches, neck 15.5 inches.  HOME MEDICATIONS:  Charted and reviewed.  SLEEP ARCHITECTURE:  Total sleep time 325.5 minutes with sleep efficiency 91.3%.  Stage I was 7.1%, stage II 83.7%, stage III 0.9%, REM 8.3% of total sleep time.  Sleep latency of 7.5 minutes, REM latency 267 minutes, awake after sleep onset 22.5 minutes.  Arousal index 16.8. Bedtime medication:  Ibuprofen PM.  RESPIRATORY DATA:  Apnea/hypopnea index (AHI) 16.4 per hour.  A total of 89 events was scored including 62 obstructive apneas, 1 mixed apneas, 26 hypopneas.  Events were associated with nonsupine sleep position and REM.  REM AHI 73.3 per hour.  This is a diagnostic NPSG protocol as ordered.  CPAP titration was not done.  OXYGEN DATA:  Moderately loud snoring with oxygen desaturation to a nadir of 69% and mean oxygen saturation through the study of 94.3% on room air.  CARDIAC DATA:  Sinus rhythm with occasional PVC.  MOVEMENT/PARASOMNIA:  No significant movement disturbance.  No bathroom trips.  IMPRESSION/RECOMMENDATION: 1. Moderate obstructive sleep apnea/hypopnea syndrome, AHI 16.4 per     hour.  Nonsupine events.  Moderately loud snoring with oxygen     desaturation to a nadir of 69% and mean oxygen saturation through     the study of 94.3% on room air. 2. This is a diagnostic NPSG protocol as ordered.  CPAP titration was     not done.  Consider return for dedicated CPAP titration study or     evaluate  for alternative management, as clinically appropriate.    Milos Milligan D. Maple Hudson, MD, [facility], FACP Diplomate, American Board of Sleep Medicine   CDY/MEDQ  D:  11/12/2011 09:45:02  T:  11/13/2011 01:48:43  Job:  [id number]

## 2012-01-30 ENCOUNTER — Emergency Department (HOSPITAL_COMMUNITY): Payer: Medicaid Other

## 2012-01-30 ENCOUNTER — Encounter (HOSPITAL_COMMUNITY): Payer: Self-pay | Admitting: *Deleted

## 2012-01-30 ENCOUNTER — Emergency Department (HOSPITAL_COMMUNITY)
Admission: EM | Admit: 2012-01-30 | Discharge: 2012-01-30 | Disposition: A | Discharge status: Home / Self Care | Payer: Medicaid Other | Attending: Emergency Medicine | Admitting: Emergency Medicine

## 2012-01-30 DIAGNOSIS — H9209 Otalgia, unspecified ear: Secondary | ICD-10-CM | POA: Insufficient documentation

## 2012-01-30 DIAGNOSIS — R112 Nausea with vomiting, unspecified: Secondary | ICD-10-CM | POA: Insufficient documentation

## 2012-01-30 DIAGNOSIS — J029 Acute pharyngitis, unspecified: Secondary | ICD-10-CM | POA: Insufficient documentation

## 2012-01-30 DIAGNOSIS — R059 Cough, unspecified: Secondary | ICD-10-CM | POA: Insufficient documentation

## 2012-01-30 DIAGNOSIS — F17200 Nicotine dependence, unspecified, uncomplicated: Secondary | ICD-10-CM | POA: Insufficient documentation

## 2012-01-30 DIAGNOSIS — R05 Cough: Secondary | ICD-10-CM | POA: Insufficient documentation

## 2012-01-30 DIAGNOSIS — I10 Essential (primary) hypertension: Secondary | ICD-10-CM | POA: Insufficient documentation

## 2012-01-30 DIAGNOSIS — J40 Bronchitis, not specified as acute or chronic: Secondary | ICD-10-CM | POA: Insufficient documentation

## 2012-01-30 NOTE — ED Provider Notes (Signed)
History     CSN: [id number]  Arrival date & time 01/30/12  4098   First MD Initiated Contact with Patient 01/30/12 [phone]      Chief Complaint  Patient presents with  . Sore Throat  . Otalgia    Left Ear  . Nausea  . Emesis    x2 this AM  . Cough    (Consider location/radiation/quality/duration/timing/severity/associated sxs/prior treatment) The history is provided by the patient.   the patient is a 46 year old, morbidly obese, female, with a history of hypertension.  She smokes cigarettes.  She presents to emergency department complaining of a cough, sore throat, left earache, and nausea and vomiting since this am.  She denies diarrhea.  She states that she has had a fever, as well.  She denies a history of COPD, heart, liver, or renal disease.  Both her children have similar symptoms.  Past Medical History  Diagnosis Date  . Hypertension   . Headache     migraines  . Anxiety   . Depression   . Urinary tract infection   . Ectopic pregnancy   . Inflammatory polyps of colon   . Acid reflux   . Esophageal stricture   . Hiatal hernia     Past Surgical History  Procedure Date  . Ectopic pregnancy surgery   . Tonsillectomy   . Cholecystectomy   . Wisdom tooth extraction   . Ovarian cyst removal   . Mass removed from r buttock   . Laparoscopy     X 2 to remove scar tissue  . Appendectomy   . Polypectomy     polyps removed from colon during colonoscopy    Family History  Problem Relation Age of Onset  . Anesthesia problems Neg Hx     History  Substance Use Topics  . Smoking status: Current Everyday Smoker -- 0.2 packs/day for 1 years  . Smokeless tobacco: Never Used  . Alcohol Use: No    OB History    Grav Para Term Preterm Abortions TAB SAB Ect Mult Living   6 4 4  0 2 0 1 1 0 4      Review of Systems  Constitutional: Positive for fever. Negative for chills.  HENT: Positive for ear pain, sore throat and neck pain.   Respiratory: Positive for cough.  Negative for chest tightness.   Cardiovascular: Negative for chest pain.  Gastrointestinal: Positive for nausea and vomiting.  Neurological: Negative for headaches.  Psychiatric/Behavioral: Negative for confusion.  All other systems reviewed and are negative.    Allergies  Review of patient's allergies indicates no known allergies.  Home Medications   Current Outpatient Rx  Name Route Sig Dispense Refill  . NYQUIL COLD & FLU PO Oral Take 2 capsules by mouth every 8 (eight) hours as needed. Congestion and pain      BP 141/86  Pulse 73  Temp(Src) 98.2 F (36.8 C) (Oral)  SpO2 95%  Physical Exam  Nursing note and vitals reviewed. Constitutional: She is oriented to person, place, and time. No distress.       Morbidly obese  HENT:  Head: Normocephalic and atraumatic.  Left Ear: External ear normal.  Mouth/Throat: Oropharynx is clear and moist.  Eyes: Conjunctivae and EOM are normal.  Neck: Normal range of motion. Neck supple.  Cardiovascular: Normal rate.   No murmur heard. Pulmonary/Chest: Effort normal. No respiratory distress.  Abdominal: She exhibits no distension.  Musculoskeletal: Normal range of motion.  Lymphadenopathy:    She has  cervical adenopathy.  Neurological: She is alert and oriented to person, place, and time.  Skin: Skin is warm and dry.  Psychiatric: She has a normal mood and affect. Thought content normal.    ED Course  Procedures (including critical care time) 46 year old, female, smoker presents with symptoms consistent with bronchitis versus pneumonia.  She's not in distress.  We will do a chest x-ray, to determine if she has pneumonia, which would indicate antibiotics, otherwise, we'll treat her symptomatically.  Labs Reviewed - No data to display No results found.   No diagnosis found.    MDM  Bronchitis No evidence of pneumonia, hypoxia, or respiratory distress        Cheri Guppy, MD 01/30/12 1040

## 2012-01-30 NOTE — Discharge Instructions (Signed)
Your chest x-ray does not show pneumonia.  You have a viral illness.  Use Tylenol or Motrin for pain and Robitussin for cough.  Stop smoking.  Followup with your Dr. if your symptoms.  Last more than a week.

## 2012-01-30 NOTE — ED Notes (Signed)
Pt presents to the ED with c/o sudden onset (approx 0500 today) of nausea, vomiting, fever, cough, sore throat, left ear pain, and neck fullness. Pt reports that her two children have been sick for a few days.

## 2012-05-27 ENCOUNTER — Encounter (HOSPITAL_COMMUNITY): Payer: Self-pay | Admitting: *Deleted

## 2012-05-27 ENCOUNTER — Inpatient Hospital Stay (HOSPITAL_COMMUNITY)
Admission: AD | Admit: 2012-05-27 | Discharge: 2012-05-27 | Disposition: A | Discharge status: Home / Self Care | Payer: Medicaid Other | Source: Ambulatory Visit | Attending: Obstetrics & Gynecology | Admitting: Obstetrics & Gynecology

## 2012-05-27 DIAGNOSIS — R109 Unspecified abdominal pain: Secondary | ICD-10-CM | POA: Insufficient documentation

## 2012-05-27 DIAGNOSIS — R112 Nausea with vomiting, unspecified: Secondary | ICD-10-CM | POA: Insufficient documentation

## 2012-05-27 DIAGNOSIS — A088 Other specified intestinal infections: Secondary | ICD-10-CM

## 2012-05-27 DIAGNOSIS — A084 Viral intestinal infection, unspecified: Secondary | ICD-10-CM

## 2012-05-27 DIAGNOSIS — A5901 Trichomonal vulvovaginitis: Secondary | ICD-10-CM | POA: Insufficient documentation

## 2012-05-27 DIAGNOSIS — N949 Unspecified condition associated with female genital organs and menstrual cycle: Secondary | ICD-10-CM | POA: Insufficient documentation

## 2012-05-27 DIAGNOSIS — R197 Diarrhea, unspecified: Secondary | ICD-10-CM | POA: Insufficient documentation

## 2012-05-27 DIAGNOSIS — R102 Pelvic and perineal pain: Secondary | ICD-10-CM

## 2012-05-27 DIAGNOSIS — F17200 Nicotine dependence, unspecified, uncomplicated: Secondary | ICD-10-CM

## 2012-05-27 LAB — WET PREP, GENITAL
Clue Cells Wet Prep HPF POC: NONE SEEN
Yeast Wet Prep HPF POC: NONE SEEN

## 2012-05-27 LAB — URINALYSIS, ROUTINE W REFLEX MICROSCOPIC
Glucose, UA: NEGATIVE mg/dL
Ketones, ur: NEGATIVE mg/dL
Nitrite: NEGATIVE
Protein, ur: 30 mg/dL — AB
Specific Gravity, Urine: 1.025 (ref 1.005–1.030)
Urobilinogen, UA: 0.2 mg/dL (ref 0.0–1.0)
pH: 6 (ref 5.0–8.0)

## 2012-05-27 LAB — CBC WITH DIFFERENTIAL/PLATELET
Basophils Absolute: 0 10*3/uL (ref 0.0–0.1)
Basophils Relative: 1 % (ref 0–1)
Eosinophils Absolute: 0.1 10*3/uL (ref 0.0–0.7)
Eosinophils Relative: 1 % (ref 0–5)
HCT: 38.8 % (ref 36.0–46.0)
Hemoglobin: 12.4 g/dL (ref 12.0–15.0)
Lymphocytes Relative: 45 % (ref 12–46)
Lymphs Abs: 3 10*3/uL (ref 0.7–4.0)
MCH: 29.4 pg (ref 26.0–34.0)
MCHC: 32 g/dL (ref 30.0–36.0)
MCV: 91.9 fL (ref 78.0–100.0)
Monocytes Absolute: 0.4 10*3/uL (ref 0.1–1.0)
Monocytes Relative: 7 % (ref 3–12)
Neutro Abs: 3.1 10*3/uL (ref 1.7–7.7)
Neutrophils Relative %: 47 % (ref 43–77)
Platelets: 301 10*3/uL (ref 150–400)
RBC: 4.22 MIL/uL (ref 3.87–5.11)
RDW: 13.6 % (ref 11.5–15.5)
WBC: 6.6 10*3/uL (ref 4.0–10.5)

## 2012-05-27 LAB — URINE MICROSCOPIC-ADD ON

## 2012-05-27 LAB — BASIC METABOLIC PANEL
BUN: 10 mg/dL (ref 6–23)
CO2: 29 mEq/L (ref 19–32)
Calcium: 9.4 mg/dL (ref 8.4–10.5)
Chloride: 101 mEq/L (ref 96–112)
Creatinine, Ser: 0.83 mg/dL (ref 0.50–1.10)
GFR calc Af Amer: >90 mL/min (ref >90)
GFR calc non Af Amer: 84 mL/min — ABNORMAL LOW (ref >90)
Glucose, Bld: 87 mg/dL (ref 70–99)
Potassium: 3.3 mEq/L — ABNORMAL LOW (ref 3.5–5.1)
Sodium: 138 mEq/L (ref 135–145)

## 2012-05-27 LAB — POCT PREGNANCY, URINE: Preg Test, Ur: NEGATIVE

## 2012-05-27 MED ORDER — ONDANSETRON 4 MG PO TBDP
4.0000 mg | ORAL_TABLET | Freq: Once | ORAL | Status: AC
  Administered 2012-05-27: 4 mg via ORAL
  Filled 2012-05-27: qty 1

## 2012-05-27 MED ORDER — METRONIDAZOLE 500 MG PO TABS: 500.0000 mg | ORAL_TABLET | Freq: Once | ORAL | Status: DC

## 2012-05-27 MED ORDER — ONDANSETRON HCL 4 MG PO TABS: 4.0000 mg | ORAL_TABLET | Freq: Three times a day (TID) | ORAL | Status: DC | PRN

## 2012-05-27 MED ORDER — ACETAMINOPHEN 500 MG PO TABS
1000.0000 mg | ORAL_TABLET | Freq: Once | ORAL | Status: AC
  Administered 2012-05-27: 1000 mg via ORAL
  Filled 2012-05-27: qty 2

## 2012-05-27 NOTE — MAU Provider Note (Signed)
Medical Screening exam and patient care preformed by advanced practice provider.  Agree with the above management.  

## 2012-05-27 NOTE — MAU Note (Signed)
Yesterday was really feeling bad.  Started with dizziness, nausea/vomiting and diarrhea last Thursday. Diarrhea has stopped but still having nausea and vomiting.  Pain in lower abd.

## 2012-05-27 NOTE — MAU Note (Signed)
Pt presents for N/V/D that has been occurring x2weeks, and was preceded by lower abdominal pain.  Diarrhea has stopped as of this morning.  Pt states running fever x2days, with max being 103.3 yesterday.  Pt took Tylenol then.

## 2012-05-27 NOTE — [facility]U Provider Note (Signed)
Chief Complaint: Nausea, Emesis and Abdominal Pain   First Provider Initiated Contact with Patient 05/27/12 1130     SUBJECTIVE HPI: Bailey Russell is a 46 y.o. [id number]  who presents to MAU reporting 2week hx of N/V and 3 days of diarrhea with fever of 103.1 yesterday. She reports vomiting shortly after every meal. Denies specific food intolerances and is on a "30 day challenge" weight loss diet. She is nauseated now and beginning to get a frontal headache. She has been having crampy pelvic pain intermittently since a few days before LMP 05/19/12. Missed menses in August, otherwise regular cycles with normal flow.   Past Medical History  Diagnosis Date  . Hypertension   . Headache     migraines  . Anxiety   . Depression   . Urinary tract infection   . Ectopic pregnancy   . Inflammatory polyps of colon   . Acid reflux   . Esophageal stricture   . Hiatal hernia    OB History    Grav Para Term Preterm Abortions TAB SAB Ect Mult Living   6 4 4  0 2 0 1 1 0 4     # Outc Date GA Lbr Len/2nd Wgt Sex Del Anes PTL Lv   1 TRM            2 TRM            3 TRM            4 TRM            5 SAB            6 ECT              Past Surgical History  Procedure Date  . Ectopic pregnancy surgery   . Tonsillectomy   . Cholecystectomy   . Wisdom tooth extraction   . Ovarian cyst removal   . Mass removed from r buttock   . Laparoscopy     X 2 to remove scar tissue  . Appendectomy   . Polypectomy     polyps removed from colon during colonoscopy   History   Social History  . Marital Status: Widowed    Spouse Name: N/A    Number of Children: N/A  . Years of Education: N/A   Occupational History  . Not on file.   Social History Main Topics  . Smoking status: Current Every Day Smoker -- 0.2 packs/day for 1 years  . Smokeless tobacco: Never Used  . Alcohol Use: No  . Drug Use: No  . Sexually Active: No     Last intercourse over 1 month ago   Other Topics Concern  . Not  on file   Social History Narrative  . No narrative on file   No current facility-administered medications on file prior to encounter.   No current outpatient prescriptions on file prior to encounter.   No Known Allergies  ROS: Pertinent items in HPI  OBJECTIVE Blood pressure 122/81, pulse 79, temperature 98.1 F (36.7 C), temperature source Oral, resp. rate 20, height 5' 8.5" (1.74 m), weight 127.914 kg (282 lb), last menstrual period 05/19/2012. GENERAL: Well-developed, well-nourished female in no acute distress.  HEENT: Normocephalic HEART: normal rate RESP: normal effort ABDOMEN: Soft, non-tender EXTREMITIES: Nontender, no edema NEURO: Alert and oriented SPECULUM EXAM: NEFG, bubbly thin discharge, no blood noted, cervix clean, multiparous BIMANUAL: cervix post; unable to outline uterus but tenderness seems primarily vaginal and not uterine, no adnexal  tenderness or masses  LAB RESULTS Results for orders placed during the hospital encounter of 05/27/12 (from the past 24 hour(s))  URINALYSIS, ROUTINE W REFLEX MICROSCOPIC     Status: Abnormal   Collection Time   05/27/12 10:13 AM      Component Value Range   Color, Urine YELLOW  YELLOW   APPearance CLEAR  CLEAR   Specific Gravity, Urine 1.025  1.005 - 1.030   pH 6.0  5.0 - 8.0   Glucose, UA NEGATIVE  NEGATIVE mg/dL   Hgb urine dipstick SMALL (*) NEGATIVE   Bilirubin Urine SMALL (*) NEGATIVE   Ketones, ur NEGATIVE  NEGATIVE mg/dL   Protein, ur 30 (*) NEGATIVE mg/dL   Urobilinogen, UA 0.2  0.0 - 1.0 mg/dL   Nitrite NEGATIVE  NEGATIVE   Leukocytes, UA MODERATE (*) NEGATIVE  URINE MICROSCOPIC-ADD ON     Status: Abnormal   Collection Time   05/27/12 10:13 AM      Component Value Range   Squamous Epithelial / LPF FEW (*) RARE   WBC, UA 7-10  <3 WBC/hpf   RBC / HPF 3-6  <3 RBC/hpf   Bacteria, UA FEW (*) RARE   Urine-Other MUCOUS PRESENT    POCT PREGNANCY, URINE     Status: Normal   Collection Time   05/27/12 10:20 AM       Component Value Range   Preg Test, Ur NEGATIVE  NEGATIVE  CBC WITH DIFFERENTIAL     Status: Normal   Collection Time   05/27/12 11:45 AM      Component Value Range   WBC 6.6  4.0 - 10.5 K/uL   RBC 4.22  3.87 - 5.11 MIL/uL   Hemoglobin 12.4  12.0 - 15.0 g/dL   HCT 96.2  95.2 - 84.1 %   MCV 91.9  78.0 - 100.0 fL   MCH 29.4  26.0 - 34.0 pg   MCHC 32.0  30.0 - 36.0 g/dL   RDW 32.4  40.1 - 02.7 %   Platelets 301  150 - 400 K/uL   Neutrophils Relative 47  43 - 77 %   Neutro Abs 3.1  1.7 - 7.7 K/uL   Lymphocytes Relative 45  12 - 46 %   Lymphs Abs 3.0  0.7 - 4.0 K/uL   Monocytes Relative 7  3 - 12 %   Monocytes Absolute 0.4  0.1 - 1.0 K/uL   Eosinophils Relative 1  0 - 5 %   Eosinophils Absolute 0.1  0.0 - 0.7 K/uL   Basophils Relative 1  0 - 1 %   Basophils Absolute 0.0  0.0 - 0.1 K/uL  BASIC METABOLIC PANEL     Status: Abnormal   Collection Time   05/27/12 11:45 AM      Component Value Range   Sodium 138  135 - 145 mEq/L   Potassium 3.3 (*) 3.5 - 5.1 mEq/L   Chloride 101  96 - 112 mEq/L   CO2 29  19 - 32 mEq/L   Glucose, Bld 87  70 - 99 mg/dL   BUN 10  6 - 23 mg/dL   Creatinine, Ser 2.53  0.50 - 1.10 mg/dL   Calcium 9.4  8.4 - 66.4 mg/dL   GFR calc non Af Amer 84 (*) >90 mL/min   GFR calc Af Amer >90  >90 mL/min  WET PREP, GENITAL     Status: Abnormal   Collection Time   05/27/12  1:19 PM  Component Value Range   Yeast Wet Prep HPF POC NONE SEEN  NONE SEEN   Trich, Wet Prep FEW (*) NONE SEEN   Clue Cells Wet Prep HPF POC NONE SEEN  NONE SEEN   WBC, Wet Prep HPF POC MANY (*) NONE SEEN   ED COURSE No vomiting or diarrhea while observed in [facility]U. Received acetaminophen 1000mg  po and Zofran 4 mg ODT with some relief of sx.   ASSESSMENT 1. Viral gastroenteritis   2. Morbid obesity   3. Pelvic pain in female   4. Smoker   5. Trichomonal vaginitis     PLAN Discharge home with F/U at GYN clinic   Medication List     As of 05/27/2012  1:57 PM    TAKE these  medications         acetaminophen 500 MG tablet   Commonly known as: TYLENOL   Take 1,000 mg by mouth every 6 (six) hours as needed. For pain/headache      ALKA-SELTZER PLUS COLD PO   Take 1 each by mouth daily as needed. For cold symptoms      hydrochlorothiazide 25 MG tablet   Commonly known as: HYDRODIURIL   Take 25 mg by mouth daily.      metroNIDAZOLE 500 MG tablet   Commonly known as: FLAGYL   Take 1 tablet (500 mg total) by mouth once.      ondansetron 4 MG tablet   Commonly known as: ZOFRAN   Take 1 tablet (4 mg total) by mouth every 8 (eight) hours as needed for nausea.       AVS on smoking, trich, VGE   Danae Orleans, CNM 05/27/2012  11:38 AM

## 2012-05-28 LAB — GC/CHLAMYDIA PROBE AMP, GENITAL
Chlamydia, DNA Probe: NEGATIVE
GC Probe Amp, Genital: NEGATIVE

## 2012-06-17 ENCOUNTER — Encounter: Payer: Medicaid Other | Admitting: Family Medicine

## 2012-06-23 ENCOUNTER — Emergency Department (HOSPITAL_COMMUNITY)
Admission: EM | Admit: 2012-06-23 | Discharge: 2012-06-23 | Disposition: A | Discharge status: Home / Self Care | Payer: Medicaid Other | Attending: Emergency Medicine | Admitting: Emergency Medicine

## 2012-06-23 ENCOUNTER — Encounter (HOSPITAL_COMMUNITY): Payer: Self-pay | Admitting: Emergency Medicine

## 2012-06-23 DIAGNOSIS — S161XXA Strain of muscle, fascia and tendon at neck level, initial encounter: Secondary | ICD-10-CM

## 2012-06-23 DIAGNOSIS — F3289 Other specified depressive episodes: Secondary | ICD-10-CM | POA: Insufficient documentation

## 2012-06-23 DIAGNOSIS — S139XXA Sprain of joints and ligaments of unspecified parts of neck, initial encounter: Secondary | ICD-10-CM | POA: Insufficient documentation

## 2012-06-23 DIAGNOSIS — F329 Major depressive disorder, single episode, unspecified: Secondary | ICD-10-CM | POA: Insufficient documentation

## 2012-06-23 DIAGNOSIS — G43909 Migraine, unspecified, not intractable, without status migrainosus: Secondary | ICD-10-CM | POA: Insufficient documentation

## 2012-06-23 DIAGNOSIS — K219 Gastro-esophageal reflux disease without esophagitis: Secondary | ICD-10-CM | POA: Insufficient documentation

## 2012-06-23 DIAGNOSIS — F411 Generalized anxiety disorder: Secondary | ICD-10-CM | POA: Insufficient documentation

## 2012-06-23 DIAGNOSIS — I10 Essential (primary) hypertension: Secondary | ICD-10-CM | POA: Insufficient documentation

## 2012-06-23 DIAGNOSIS — F17200 Nicotine dependence, unspecified, uncomplicated: Secondary | ICD-10-CM | POA: Insufficient documentation

## 2012-06-23 MED ORDER — IBUPROFEN 200 MG PO TABS
600.0000 mg | ORAL_TABLET | Freq: Once | ORAL | Status: AC
  Administered 2012-06-23: 600 mg via ORAL
  Filled 2012-06-23: qty 3

## 2012-06-23 MED ORDER — CYCLOBENZAPRINE HCL 5 MG PO TABS: 5.0000 mg | ORAL_TABLET | Freq: Three times a day (TID) | ORAL | Status: DC | PRN

## 2012-06-23 MED ORDER — HYDROCODONE-ACETAMINOPHEN 5-325 MG PO TABS: 1.0000 | ORAL_TABLET | ORAL | Status: DC | PRN

## 2012-06-23 MED ORDER — IBUPROFEN 600 MG PO TABS: 600.0000 mg | ORAL_TABLET | Freq: Three times a day (TID) | ORAL | Status: DC | PRN
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
## 2012-06-23 NOTE — ED Notes (Signed)
Ice pack to R shoulder  ?

## 2012-07-03 ENCOUNTER — Encounter: Payer: Self-pay | Admitting: Advanced Practice Midwife

## 2012-07-03 ENCOUNTER — Ambulatory Visit (INDEPENDENT_AMBULATORY_CARE_PROVIDER_SITE_OTHER): Payer: Medicaid Other | Admitting: Advanced Practice Midwife

## 2012-07-03 ENCOUNTER — Encounter: Payer: Self-pay | Admitting: Obstetrics & Gynecology

## 2012-07-03 VITALS — BP 140/97 | HR 91 | Temp 99.5°F | Ht 68.0 in | Wt 297.5 lb

## 2012-07-03 DIAGNOSIS — IMO0001 Reserved for inherently not codable concepts without codable children: Secondary | ICD-10-CM

## 2012-07-03 DIAGNOSIS — Z202 Contact with and (suspected) exposure to infections with a predominantly sexual mode of transmission: Secondary | ICD-10-CM | POA: Insufficient documentation

## 2012-07-03 DIAGNOSIS — G4730 Sleep apnea, unspecified: Secondary | ICD-10-CM | POA: Insufficient documentation

## 2012-07-03 DIAGNOSIS — R030 Elevated blood-pressure reading, without diagnosis of hypertension: Secondary | ICD-10-CM

## 2012-07-03 DIAGNOSIS — K5790 Diverticulosis of intestine, part unspecified, without perforation or abscess without bleeding: Secondary | ICD-10-CM | POA: Insufficient documentation

## 2012-07-03 DIAGNOSIS — Z2089 Contact with and (suspected) exposure to other communicable diseases: Secondary | ICD-10-CM

## 2012-07-03 DIAGNOSIS — R102 Pelvic and perineal pain: Secondary | ICD-10-CM

## 2012-07-03 DIAGNOSIS — D259 Leiomyoma of uterus, unspecified: Secondary | ICD-10-CM | POA: Insufficient documentation

## 2012-07-03 DIAGNOSIS — N949 Unspecified condition associated with female genital organs and menstrual cycle: Secondary | ICD-10-CM

## 2012-07-03 DIAGNOSIS — K5730 Diverticulosis of large intestine without perforation or abscess without bleeding: Secondary | ICD-10-CM

## 2012-07-03 NOTE — Progress Notes (Signed)
  Subjective:    Patient ID: Bailey Russell, female    DOB: 01-27-1966, 46 y.o.   MRN: [id number]  HPI This is a 46 y.o. female who presents for evaluation of pelvic pain. Pain is mostly now in LLQ (has had many visits for RLQ pain).  Had a diagnostic laparoscopy last year by Dr Marice Potter. Has already had appendix removed.  Had a large right ovarian cyst removed in [place] years ago.   Has known fibroids, but periods arent terrible, though some are heavy.   Also has been diagnosed several times with trichomonas but confused about treatment. Last year had a positive test in [facility] but did not take med and states Dr Okey Dupre told her his test was negative.  Husband never took treatment. States he went "to get checked out and didn't have anything".  Does not know if he was tested for trich per se.  He is her only partner.   I offered to treat her today, but she chooses instead to be retested.   Past Surgical History  Procedure Date  . Ectopic pregnancy surgery   . Tonsillectomy   . Cholecystectomy   . Wisdom tooth extraction   . Ovarian cyst removal   . Mass removed from r buttock   . Laparoscopy     X 2 to remove scar tissue  . Appendectomy   . Polypectomy     polyps removed from colon during colonoscopy     Review of Systems See HPI     Objective:   Physical Exam  Constitutional: She is oriented to person, place, and time. She appears well-developed and well-nourished. No distress.       Morbidly obese   Cardiovascular: Normal rate.   Pulmonary/Chest: Effort normal.  Abdominal: Soft. She exhibits no distension and no mass. There is no tenderness. There is no rebound and no guarding.       Exam limited by habitus. Unable to feel pelvic organs well.  Grossly normal and nontender  Genitourinary: Uterus normal. Vaginal discharge found.  Musculoskeletal: Normal range of motion.  Neurological: She is alert and oriented to person, place, and time.  Skin: Skin is warm and dry.    Psychiatric: She has a normal mood and affect.    GC/Chlamydia and wet prep done.     Assessment & Plan:  A:  Pelvic pain      Trichomonas contact      Known fibroids      Known diverticulosis  P:  Advised testing for GC/Ch also, agrees      Treat as indicated      Ultrasound ordered to evaluate for ovarian cyst (pt worried she has one) and measure fibroids      Return visit with MD to discuss surgical options. Patient very much wants a hysterectomy, but I told her there are not strong indications for that at this time.  They may or may not agree that this is a viable option for treatment of long term pelvic pain.       Advised if there is a simple ovarian cyst, we would not do surgery for that.  Usually time will show resolution.      STD counseling reviewed. Advised to have partner treated for trich.       Discussed pain may be related to diverticulosis

## 2012-07-03 NOTE — Patient Instructions (Addendum)
Fibroids Fibroids are lumps (tumors) that can occur any place in a woman's body. These lumps are not cancerous. Fibroids vary in size, weight, and where they grow. HOME CARE  Do not take aspirin.  Write down the number of pads or tampons you use during your period. Tell your doctor. This can help determine the best treatment for you. GET HELP RIGHT AWAY IF:  You have pain in your lower belly (abdomen) that is not helped with medicine.  You have cramps that are not helped with medicine.  You have more bleeding between or during your period.  You feel lightheaded or pass out (faint).  Your lower belly pain gets worse. MAKE SURE YOU:  Understand these instructions.  Will watch your condition.  Will get help right away if you are not doing well or get worse. Document Released: 09/23/2010 Document Revised: 11/13/2011 Document Reviewed: 09/23/2010 [employer] Patient Information 2013 [employer]. Hysterectomy Information  A hysterectomy is a procedure where your uterus is surgically removed. It will no longer be possible to have menstrual periods or to become pregnant. The tubes and ovaries can be removed (bilateral salpingo-oopherectomy) during this surgery as well.  REASONS FOR A HYSTERECTOMY  Persistent, abnormal bleeding.  Lasting (chronic) pelvic pain or infection.  The lining of the uterus (endometrium) starts growing outside the uterus (endometriosis).  The endometrium starts growing in the muscle of the uterus (adenomyosis).  The uterus falls down into the vagina (pelvic organ prolapse).  Symptomatic uterine fibroids.  Precancerous cells.  Cervical cancer or uterine cancer. TYPES OF HYSTERECTOMIES  Supracervical hysterectomy. This type removes the top part of the uterus, but not the cervix.  Total hysterectomy. This type removes the uterus and cervix.  Radical hysterectomy. This type removes the uterus, cervix, and the fibrous tissue that holds the uterus in  place in the pelvis (parametrium). WAYS A HYSTERECTOMY CAN BE PERFORMED  Abdominal hysterectomy. A large surgical cut (incision) is made in the abdomen. The uterus is removed through this incision.  Vaginal hysterectomy. An incision is made in the vagina. The uterus is removed through this incision. There are no abdominal incisions.  Conventional laparoscopic hysterectomy. A thin, lighted tube with a camera (laparoscope) is inserted into 3 or 4 small incisions in the abdomen. The uterus is cut into small pieces. The small pieces are removed through the incisions, or they are removed through the vagina.  Laparoscopic assisted vaginal hysterectomy (LAVH). Three or four small incisions are made in the abdomen. Part of the surgery is performed laparoscopically and part vaginally. The uterus is removed through the vagina.  Robot-assisted laparoscopic hysterectomy. A laparoscope is inserted into 3 or 4 small incisions in the abdomen. A computer-controlled device is used to give the surgeon a 3D image. This allows for more precise movements of surgical instruments. The uterus is cut into small pieces and removed through the incisions or removed through the vagina. RISKS OF HYSTERECTOMY   Bleeding and risk of blood transfusion. Tell your caregiver if you do not want to receive any blood products.  Blood clots in the legs or lung.  Infection.  Injury to surrounding organs.  Anesthesia problems or side effects.  Conversion to an abdominal hysterectomy. WHAT TO EXPECT AFTER A HYSTERECTOMY  You will be given pain medicine.  You will need to have someone with you for the first 3 to 5 days after you go home.  You will need to follow up with your surgeon in 2 to 4 weeks after surgery   to evaluate your progress.  You may have early menopause symptoms like hot flashes, night sweats, and insomnia.  If you had a hysterectomy for a problem that was not a cancer or a condition that could lead to cancer,  then you no longer need Pap tests. However, even if you no longer need a Pap test, a regular exam is a good idea to make sure no other problems are starting. Document Released: 02/14/2001 Document Revised: 11/13/2011 Document Reviewed: 04/01/2011 Exit[employer] Patient Information 2013 [employer].  

## 2012-07-04 ENCOUNTER — Other Ambulatory Visit: Payer: Self-pay

## 2012-07-04 LAB — GC/CHLAMYDIA PROBE AMP, GENITAL
Chlamydia, DNA Probe: NEGATIVE
GC Probe Amp, Genital: NEGATIVE

## 2012-07-04 LAB — WET PREP, GENITAL
Trich, Wet Prep: NONE SEEN
Yeast Wet Prep HPF POC: NONE SEEN

## 2012-07-04 MED ORDER — METRONIDAZOLE 500 MG PO TABS: 2000.0000 mg | ORAL_TABLET | Freq: Once | ORAL | Status: DC

## 2012-07-04 NOTE — Telephone Encounter (Signed)
Pt called and stated that she was seen yesterday and the provider stated that she would prescribe something.  The pt stated that she was told she had trichomonas.  I informed pt that I would send in a Rx for flagyl to treat her infection.  Pt stated understanding and had no further questions.

## 2012-07-05 ENCOUNTER — Ambulatory Visit (HOSPITAL_COMMUNITY)
Admission: RE | Admit: 2012-07-05 | Discharge: 2012-07-05 | Disposition: A | Discharge status: Home / Self Care | Payer: Medicaid Other | Source: Ambulatory Visit | Attending: Advanced Practice Midwife | Admitting: Advanced Practice Midwife

## 2012-07-05 DIAGNOSIS — R102 Pelvic and perineal pain unspecified side: Secondary | ICD-10-CM

## 2012-07-05 DIAGNOSIS — N949 Unspecified condition associated with female genital organs and menstrual cycle: Secondary | ICD-10-CM | POA: Insufficient documentation

## 2012-07-05 DIAGNOSIS — D250 Submucous leiomyoma of uterus: Secondary | ICD-10-CM | POA: Insufficient documentation

## 2012-07-05 DIAGNOSIS — R1032 Left lower quadrant pain: Secondary | ICD-10-CM | POA: Insufficient documentation

## 2012-07-17 ENCOUNTER — Ambulatory Visit: Payer: Medicaid Other | Admitting: Advanced Practice Midwife

## 2012-07-18 ENCOUNTER — Ambulatory Visit: Payer: Medicaid Other | Admitting: Obstetrics & Gynecology

## 2012-07-22 ENCOUNTER — Ambulatory Visit: Payer: Medicaid Other | Admitting: Obstetrics & Gynecology

## 2012-08-07 ENCOUNTER — Ambulatory Visit: Payer: Medicaid Other | Admitting: Obstetrics & Gynecology

## 2012-09-03 ENCOUNTER — Emergency Department (HOSPITAL_COMMUNITY)
Admission: EM | Admit: 2012-09-03 | Discharge: 2012-09-03 | Disposition: A | Discharge status: Home / Self Care | Payer: Self-pay | Attending: Emergency Medicine | Admitting: Emergency Medicine

## 2012-09-03 ENCOUNTER — Encounter (HOSPITAL_COMMUNITY): Payer: Self-pay | Admitting: *Deleted

## 2012-09-03 ENCOUNTER — Other Ambulatory Visit: Payer: Self-pay

## 2012-09-03 ENCOUNTER — Emergency Department (HOSPITAL_COMMUNITY): Payer: Self-pay

## 2012-09-03 DIAGNOSIS — Z860100 Personal history of colon polyps, unspecified: Secondary | ICD-10-CM | POA: Insufficient documentation

## 2012-09-03 DIAGNOSIS — F17200 Nicotine dependence, unspecified, uncomplicated: Secondary | ICD-10-CM | POA: Insufficient documentation

## 2012-09-03 DIAGNOSIS — G43909 Migraine, unspecified, not intractable, without status migrainosus: Secondary | ICD-10-CM | POA: Insufficient documentation

## 2012-09-03 DIAGNOSIS — R059 Cough, unspecified: Secondary | ICD-10-CM | POA: Insufficient documentation

## 2012-09-03 DIAGNOSIS — H53149 Visual discomfort, unspecified: Secondary | ICD-10-CM | POA: Insufficient documentation

## 2012-09-03 DIAGNOSIS — R079 Chest pain, unspecified: Secondary | ICD-10-CM

## 2012-09-03 DIAGNOSIS — R0789 Other chest pain: Secondary | ICD-10-CM | POA: Insufficient documentation

## 2012-09-03 DIAGNOSIS — R05 Cough: Secondary | ICD-10-CM | POA: Insufficient documentation

## 2012-09-03 DIAGNOSIS — Z79899 Other long term (current) drug therapy: Secondary | ICD-10-CM | POA: Insufficient documentation

## 2012-09-03 DIAGNOSIS — Z87440 Personal history of urinary (tract) infections: Secondary | ICD-10-CM | POA: Insufficient documentation

## 2012-09-03 DIAGNOSIS — Z8659 Personal history of other mental and behavioral disorders: Secondary | ICD-10-CM | POA: Insufficient documentation

## 2012-09-03 DIAGNOSIS — I10 Essential (primary) hypertension: Secondary | ICD-10-CM | POA: Insufficient documentation

## 2012-09-03 DIAGNOSIS — I252 Old myocardial infarction: Secondary | ICD-10-CM | POA: Insufficient documentation

## 2012-09-03 DIAGNOSIS — Z8719 Personal history of other diseases of the digestive system: Secondary | ICD-10-CM | POA: Insufficient documentation

## 2012-09-03 LAB — CBC WITH DIFFERENTIAL/PLATELET
Basophils Absolute: 0 10*3/uL (ref 0.0–0.1)
Basophils Relative: 1 % (ref 0–1)
Eosinophils Absolute: 0.1 10*3/uL (ref 0.0–0.7)
Eosinophils Relative: 2 % (ref 0–5)
HCT: 38.4 % (ref 36.0–46.0)
Hemoglobin: 12.7 g/dL (ref 12.0–15.0)
Lymphocytes Relative: 47 % — ABNORMAL HIGH (ref 12–46)
Lymphs Abs: 3.6 10*3/uL (ref 0.7–4.0)
MCH: 30.9 pg (ref 26.0–34.0)
MCHC: 33.1 g/dL (ref 30.0–36.0)
MCV: 93.4 fL (ref 78.0–100.0)
Monocytes Absolute: 0.5 10*3/uL (ref 0.1–1.0)
Monocytes Relative: 7 % (ref 3–12)
Neutro Abs: 3.3 10*3/uL (ref 1.7–7.7)
Neutrophils Relative %: 43 % (ref 43–77)
Platelets: 354 10*3/uL (ref 150–400)
RBC: 4.11 MIL/uL (ref 3.87–5.11)
RDW: 13.5 % (ref 11.5–15.5)
WBC: 7.6 10*3/uL (ref 4.0–10.5)

## 2012-09-03 LAB — COMPREHENSIVE METABOLIC PANEL
ALT: 14 U/L (ref 0–35)
AST: 18 U/L (ref 0–37)
Albumin: 3.7 g/dL (ref 3.5–5.2)
Alkaline Phosphatase: 62 U/L (ref 39–117)
BUN: 9 mg/dL (ref 6–23)
CO2: 28 mEq/L (ref 19–32)
Calcium: 9.6 mg/dL (ref 8.4–10.5)
Chloride: 103 mEq/L (ref 96–112)
Creatinine, Ser: 0.86 mg/dL (ref 0.50–1.10)
GFR calc Af Amer: >90 mL/min (ref >90)
GFR calc non Af Amer: 80 mL/min — ABNORMAL LOW (ref >90)
Glucose, Bld: 79 mg/dL (ref 70–99)
Potassium: 3.6 mEq/L (ref 3.5–5.1)
Sodium: 140 mEq/L (ref 135–145)
Total Bilirubin: 0.2 mg/dL — ABNORMAL LOW (ref 0.3–1.2)
Total Protein: 7.7 g/dL (ref 6.0–8.3)

## 2012-09-03 LAB — POCT I-STAT TROPONIN I
Troponin i, poc: 0 ng/mL (ref 0.00–0.08)
Troponin i, poc: 0 ng/mL (ref 0.00–0.08)

## 2012-09-03 MED ORDER — ONDANSETRON HCL 4 MG/2ML IJ SOLN
4.0000 mg | INTRAMUSCULAR | Status: AC
  Administered 2012-09-03: 4 mg via INTRAVENOUS
  Filled 2012-09-03: qty 2

## 2012-09-03 MED ORDER — MORPHINE SULFATE 4 MG/ML IJ SOLN
4.0000 mg | Freq: Once | INTRAMUSCULAR | Status: AC
  Administered 2012-09-03: 4 mg via INTRAVENOUS
  Filled 2012-09-03: qty 1

## 2012-09-03 MED ORDER — ASPIRIN 81 MG PO CHEW
324.0000 mg | CHEWABLE_TABLET | Freq: Once | ORAL | Status: AC
  Administered 2012-09-03: 324 mg via ORAL
  Filled 2012-09-03: qty 4

## 2012-09-03 MED ORDER — MORPHINE SULFATE 4 MG/ML IJ SOLN
2.0000 mg | Freq: Once | INTRAMUSCULAR | Status: AC
  Administered 2012-09-03: 2 mg via INTRAVENOUS
  Filled 2012-09-03: qty 1

## 2012-09-03 NOTE — ED Provider Notes (Signed)
History     CSN: [id number]  Arrival date & time 09/03/12  1716   First MD Initiated Contact with Patient 09/03/12 1759      Chief Complaint  Patient presents with  . Migraine  . Chest Pain    (Consider location/radiation/quality/duration/timing/severity/associated sxs/prior treatment) Patient is a 46 y.o. female presenting with migraines and chest pain. The history is provided by the patient and medical records.  Migraine Associated symptoms include chest pain and headaches. Pertinent negatives include no abdominal pain, coughing, diaphoresis, fatigue, fever, nausea, neck pain, rash or vomiting.  Chest Pain Pertinent negatives for primary symptoms include no fever, no fatigue, no shortness of breath, no cough, no wheezing, no abdominal pain, no nausea, no vomiting and no dizziness.  Pertinent negatives for associated symptoms include no diaphoresis.     Bailey Russell is a 46 y.o. female  with a hx of HTN, MI, migraine, anxiety, depression presents to the Emergency Department complaining of gradual, persistent, progressively worsening chest pain onset this morning.  Chest pain is sharp, located in the middle of the chest, non radiating, 10/10.  This pain is different from the MI.  Headache began with the Chest pain.  Pt does not take any medications on a regular basis.  Associated symptoms include headache, migraine headache, photophobia, phonophobia, intermittent dry cough.  Nothing makes it better and nothing makes it worse.  Pt denies nausea, vomiting, diarrhea, nasal congestion, weakness, dizziness, syncope.  .  .     Past Medical History  Diagnosis Date  . Hypertension   . Headache     migraines  . Anxiety   . Depression   . Urinary tract infection   . Ectopic pregnancy   . Inflammatory polyps of colon   . Acid reflux   . Esophageal stricture   . Hiatal hernia     Past Surgical History  Procedure Date  . Ectopic pregnancy surgery   . Tonsillectomy   .  Cholecystectomy   . Wisdom tooth extraction   . Ovarian cyst removal   . Mass removed from r buttock   . Laparoscopy     X 2 to remove scar tissue  . Appendectomy   . Polypectomy     polyps removed from colon during colonoscopy    Family History  Problem Relation Age of Onset  . Anesthesia problems Neg Hx     History  Substance Use Topics  . Smoking status: Current Every Day Smoker -- 0.2 packs/day for 1 years  . Smokeless tobacco: Never Used  . Alcohol Use: No    OB History    Grav Para Term Preterm Abortions TAB SAB Ect Mult Living   6 4 4  0 2 0 1 1 0 4      Review of Systems  Constitutional: Negative for fever, diaphoresis, appetite change, fatigue and unexpected weight change.  HENT: Negative for mouth sores, neck pain and neck stiffness.   Eyes: Positive for photophobia. Negative for visual disturbance.  Respiratory: Negative for cough, chest tightness, shortness of breath and wheezing.   Cardiovascular: Positive for chest pain.  Gastrointestinal: Negative for nausea, vomiting, abdominal pain, diarrhea and constipation.  Genitourinary: Negative for dysuria, urgency, frequency and hematuria.  Musculoskeletal: Negative for back pain.  Skin: Negative for rash.  Neurological: Positive for headaches. Negative for dizziness, syncope and light-headedness.  Hematological: Does not bruise/bleed easily.  Psychiatric/Behavioral: Negative for sleep disturbance. The patient is not nervous/anxious.   All other systems reviewed and are negative.  Allergies  Review of patient's allergies indicates no known allergies.  Home Medications   Current Outpatient Rx  Name  Route  Sig  Dispense  Refill  . DIPHENHYDRAMINE-APAP (SLEEP) 25-500 MG PO TABS   Oral   Take 3 tablets by mouth once.           BP 138/85  Pulse 65  Temp 98.4 F (36.9 C) (Oral)  Resp 19  SpO2 100%  LMP 09/02/2012  Physical Exam  Nursing note and vitals reviewed. Constitutional: She is oriented  to person, place, and time. She appears well-developed and well-nourished. No distress.  HENT:  Head: Normocephalic and atraumatic.  Right Ear: Tympanic membrane, external ear and ear canal normal.  Left Ear: Tympanic membrane, external ear and ear canal normal.  Nose: Nose normal. Right sinus exhibits no maxillary sinus tenderness and no frontal sinus tenderness. Left sinus exhibits no maxillary sinus tenderness and no frontal sinus tenderness.  Mouth/Throat: Uvula is midline, oropharynx is clear and moist and mucous membranes are normal. Mucous membranes are not dry. No oropharyngeal exudate, posterior oropharyngeal edema, posterior oropharyngeal erythema or tonsillar abscesses.  Eyes: Conjunctivae normal are normal. Pupils are equal, round, and reactive to light. No scleral icterus.  Neck: Normal range of motion. Neck supple.  Cardiovascular: Normal rate, regular rhythm, normal heart sounds and intact distal pulses.  Exam reveals no gallop and no friction rub.   No murmur heard. Pulmonary/Chest: Effort normal and breath sounds normal. No respiratory distress. She has no wheezes. She has no rales. She exhibits no tenderness.  Abdominal: Soft. Bowel sounds are normal. She exhibits no distension and no mass. There is no tenderness. There is no rebound and no guarding.  Musculoskeletal: Normal range of motion. She exhibits no edema and no tenderness.  Lymphadenopathy:    She has no cervical adenopathy.  Neurological: She is alert and oriented to person, place, and time. She exhibits normal muscle tone. Coordination normal.       Speech is clear and goal oriented Moves extremities without ataxia  Skin: Skin is warm and dry. No rash noted. She is not diaphoretic. No erythema.  Psychiatric: She has a normal mood and affect.    ED Course  Procedures (including critical care time)  Labs Reviewed  CBC WITH DIFFERENTIAL - Abnormal; Notable for the following:    Lymphocytes Relative 47 (*)      All other components within normal limits  COMPREHENSIVE METABOLIC PANEL - Abnormal; Notable for the following:    Total Bilirubin 0.2 (*)     GFR calc non Af Amer 80 (*)     All other components within normal limits  POCT I-STAT TROPONIN I   Dg Chest 2 View  09/03/2012  *RADIOLOGY REPORT*  Clinical Data: Chest pain.  CHEST - 1 VIEW  Comparison:  01/30/2012  Findings: The heart size and mediastinal contours are within normal limits.  Both lungs are clear.  IMPRESSION: No active disease.   Original Report Authenticated By: Myles Rosenthal, M.D.    ECG"  Date: 09/03/2012  Rate: 70  Rhythm: normal sinus rhythm  QRS Axis: left  Intervals: normal  ST/T Wave abnormalities: normal and nonspecific T wave changes  Conduction Disutrbances:none  Narrative Interpretation: T wave flattening in I and aVL  Old EKG Reviewed: changes noted    1. Chest pain       MDM  Bailey Russell Pt with atypical chest pain, unlike her previous MI.  Pt pain has been treated in the  department and she is currently pain free.  CMP unremarkable, CBC unremarkable and initial troponin negative.  CXR without evidence of edema, infiltrate or concerns for pneumonia.    2/2 to patient Hx I have recommended a delta troponin value.  I have discussed the patient with Bailey Russell and she will follow.            Dahlia Client Hershel Corkery, PA-C 09/03/12 2037

## 2012-09-03 NOTE — ED Notes (Signed)
Patient with sternal chest pain that started today before noon. Denies radiation, sob, or nausea.  Pt had a mild heart attack in 2010 while living in [place].  Pt does not see a cardiologist in [place].  Has not had any chest pain since previous MI.

## 2012-09-03 NOTE — ED Provider Notes (Signed)
Patient received from Muthersbaugh, PA-C.  Second troponin neg and patient asx.  VSS.  Advised f/u with her PCP.  Return precautions discussed.   Otilio Miu, PA-C 09/04/12 [phone]

## 2012-09-03 NOTE — ED Provider Notes (Signed)
Medical screening examination/treatment/procedure(s) were performed by non-physician practitioner and as supervising physician I was immediately available for consultation/collaboration.   Gwyneth Sprout, MD 09/03/12 [phone]

## 2012-09-03 NOTE — ED Notes (Signed)
Pt reports chest pain started today, central chest sharp pain 10/10. Headache started this morning, 10/10 pain. Slight dizziness.

## 2012-09-04 NOTE — ED Provider Notes (Signed)
Medical screening examination/treatment/procedure(s) were performed by non-physician practitioner and as supervising physician I was immediately available for consultation/collaboration.   Keerthana Vanrossum, MD 09/04/12 0940 

## 2013-01-28 ENCOUNTER — Emergency Department (HOSPITAL_COMMUNITY)
Admission: EM | Admit: 2013-01-28 | Discharge: 2013-01-28 | Disposition: A | Discharge status: Home / Self Care | Payer: Self-pay | Attending: Emergency Medicine | Admitting: Emergency Medicine

## 2013-01-28 ENCOUNTER — Emergency Department (HOSPITAL_COMMUNITY): Payer: Self-pay

## 2013-01-28 ENCOUNTER — Encounter (HOSPITAL_COMMUNITY): Payer: Self-pay | Admitting: Emergency Medicine

## 2013-01-28 DIAGNOSIS — Z79899 Other long term (current) drug therapy: Secondary | ICD-10-CM | POA: Insufficient documentation

## 2013-01-28 DIAGNOSIS — F329 Major depressive disorder, single episode, unspecified: Secondary | ICD-10-CM | POA: Insufficient documentation

## 2013-01-28 DIAGNOSIS — F3289 Other specified depressive episodes: Secondary | ICD-10-CM | POA: Insufficient documentation

## 2013-01-28 DIAGNOSIS — IMO0002 Reserved for concepts with insufficient information to code with codable children: Secondary | ICD-10-CM | POA: Insufficient documentation

## 2013-01-28 DIAGNOSIS — F17200 Nicotine dependence, unspecified, uncomplicated: Secondary | ICD-10-CM | POA: Insufficient documentation

## 2013-01-28 DIAGNOSIS — F411 Generalized anxiety disorder: Secondary | ICD-10-CM | POA: Insufficient documentation

## 2013-01-28 DIAGNOSIS — I10 Essential (primary) hypertension: Secondary | ICD-10-CM | POA: Insufficient documentation

## 2013-01-28 DIAGNOSIS — Z860100 Personal history of colon polyps, unspecified: Secondary | ICD-10-CM | POA: Insufficient documentation

## 2013-01-28 DIAGNOSIS — M545 Low back pain, unspecified: Secondary | ICD-10-CM | POA: Insufficient documentation

## 2013-01-28 DIAGNOSIS — Z87440 Personal history of urinary (tract) infections: Secondary | ICD-10-CM | POA: Insufficient documentation

## 2013-01-28 DIAGNOSIS — Z8669 Personal history of other diseases of the nervous system and sense organs: Secondary | ICD-10-CM | POA: Insufficient documentation

## 2013-01-28 DIAGNOSIS — Z8719 Personal history of other diseases of the digestive system: Secondary | ICD-10-CM | POA: Insufficient documentation

## 2013-01-28 MED ORDER — PREDNISONE 20 MG PO TABS: 20.0000 mg | ORAL_TABLET | Freq: Two times a day (BID) | ORAL | Status: DC

## 2013-01-28 MED ORDER — OXYCODONE-ACETAMINOPHEN 5-325 MG PO TABS
2.0000 | ORAL_TABLET | Freq: Once | ORAL | Status: AC
  Administered 2013-01-28: 2 via ORAL
  Filled 2013-01-28: qty 2

## 2013-01-28 MED ORDER — OXYCODONE-ACETAMINOPHEN 5-325 MG PO TABS: 1.0000 | ORAL_TABLET | ORAL | Status: DC | PRN

## 2013-01-28 MED ORDER — DIAZEPAM 10 MG PO TABS: 10.0000 mg | ORAL_TABLET | Freq: Four times a day (QID) | ORAL | Status: DC | PRN

## 2013-01-28 NOTE — ED Provider Notes (Signed)
History     CSN: [id number]  Arrival date & time 01/28/13  4098   First MD Initiated Contact with Patient 01/28/13 0457      Chief Complaint  Patient presents with  . Back Pain    (Consider location/radiation/quality/duration/timing/severity/associated sxs/prior treatment) HPI Comments: Bailey Russell is a 47 y.o. female who presents for evaluation of low back pain. 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 She denies bowel or urinary incontinence. She denies dysuria or urinary frequency. She denies fever, chills, nausea, vomiting, weakness, or dizziness. She drove a car here for evaluation. She's been using various over-the-counter medications for relief, without success. This has included sporadic doses of ibuprofen. There's been no trauma. The pain is sharp. The pain is occasionally worse with ambulation. This morning, the pain caused her to fall. She did not injure anything, in the fall. There are no other known modifying factors.  Patient is a 47 y.o. female presenting with back pain. The history is provided by the patient.  Back Pain   Past Medical History  Diagnosis Date  . Hypertension   . Headache(784.0)     migraines  . Anxiety   . Depression   . Urinary tract infection   . Ectopic pregnancy   . Inflammatory polyps of colon   . Acid reflux   . Esophageal stricture   . Hiatal hernia     Past Surgical History  Procedure Laterality Date  . Ectopic pregnancy surgery    . Tonsillectomy    . Cholecystectomy    . Wisdom tooth extraction    . Ovarian cyst removal    . Mass removed from r buttock    . Laparoscopy      X 2 to remove scar tissue  . Appendectomy    . Polypectomy      polyps removed from colon during colonoscopy    Family History  Problem Relation Age of Onset  . Anesthesia problems Neg Hx     History  Substance Use Topics  . Smoking status:  Current Every Day Smoker -- 0.50 packs/day for 1 years    Types: Cigarettes  . Smokeless tobacco: Never Used  . Alcohol Use: Yes     Comment: special events, wine or beer    OB History   Grav Para Term Preterm Abortions TAB SAB Ect Mult Living   6 4 4  0 2 0 1 1 0 4      Review of Systems  Musculoskeletal: Positive for back pain.  All other systems reviewed and are negative.    Allergies  Review of patient's allergies indicates no known allergies.  Home Medications   Current Outpatient Rx  Name  Route  Sig  Dispense  Refill  . diphenhydramine-acetaminophen (TYLENOL PM) 25-500 MG TABS   Oral   Take 3 tablets by mouth once.         . hydrochlorothiazide (HYDRODIURIL) 25 MG tablet   Oral   Take 25 mg by mouth daily.         Marland Kitchen ibuprofen (ADVIL,MOTRIN) 800 MG tablet   Oral   Take 800 mg by mouth every 8 (eight) hours as needed for pain (pain).         Marland Kitchen Liniments (SALONPAS) PADS   Apply externally   Apply 1 each topically as needed (pain).         . Menthol, Topical Analgesic, (BIOFREEZE  COLORLESS EX)   Apply externally   Apply 1 application topically as needed (pain).         . Menthol, Topical Analgesic, (ICY HOT EX)   Apply externally   Apply 1 application topically as needed (pain).         . Menthol, Topical Analgesic, (ICY HOT) 5 % PADS   Apply externally   Apply 1 patch topically as needed (pain).         . diazepam (VALIUM) 10 MG tablet   Oral   Take 1 tablet (10 mg total) by mouth every 6 (six) hours as needed (muscle spasm).   20 tablet   0   . oxyCODONE-acetaminophen (PERCOCET) 5-325 MG per tablet   Oral   Take 1 tablet by mouth every 4 (four) hours as needed for pain.   20 tablet   0   . predniSONE (DELTASONE) 20 MG tablet   Oral   Take 1 tablet (20 mg total) by mouth 2 (two) times daily.   10 tablet   0     BP 121/77  Pulse 73  Temp(Src) 98.5 F (36.9 C) (Oral)  Resp 63  SpO2 100%  LMP 01/05/2013  Physical Exam   Nursing note and vitals reviewed. Constitutional: She is oriented to person, place, and time. She appears well-developed.  Obese  HENT:  Head: Normocephalic and atraumatic.  Eyes: Conjunctivae and EOM are normal. Pupils are equal, round, and reactive to light.  Neck: Normal range of motion and phonation normal. Neck supple.  Cardiovascular: Normal rate, regular rhythm and intact distal pulses.   Pulmonary/Chest: Effort normal and breath sounds normal. She exhibits no tenderness.  Abdominal: Soft. She exhibits no distension. There is no tenderness. There is no guarding.  Musculoskeletal: Normal range of motion.  Diffuse lumbar tenderness with decreased flexion secondary to pain  Neurological: She is alert and oriented to person, place, and time. She has normal strength. She exhibits normal muscle tone.  Normal gait  Skin: Skin is warm and dry.  Psychiatric: She has a normal mood and affect. Her behavior is normal. Judgment and thought content normal.    ED Course  Procedures (including critical care time)  Labs Reviewed - No data to display Dg Lumbar Spine Complete  01/28/2013   *RADIOLOGY REPORT*  Clinical Data: New onset of lower back pain, radiating to the right leg.  LUMBAR SPINE - COMPLETE 4+ VIEW  Comparison: CT of the abdomen and pelvis performed 10/13/2010  Findings: There is no evidence of fracture or subluxation. Vertebral bodies demonstrate normal height and alignment. Intervertebral disc spaces are preserved.  The visualized neural foramina are grossly unremarkable in appearance.  The visualized bowel gas pattern is unremarkable in appearance; air and stool are noted within the colon.  The sacroiliac joints are within normal limits.  Clips are noted within the right upper quadrant, reflecting prior cholecystectomy.  IMPRESSION: No evidence of fracture or subluxation along the lumbar spine.   Original Report Authenticated By: Tonia Ghent, M.D.     1. Back pain, lumbosacral        MDM  Nonspecific low back pain, likely secondary to overuse. The cauda equina, neuropathy, or occult fracture. She is stable for discharge.  Nursing Notes Reviewed/ Care Coordinated, and agree without changes. Applicable Imaging Reviewed.  Interpretation of Laboratory Data incorporated into ED treatment   Plan: Home Medications- Percocet, prednisone, Valium; Home Treatments- rest, heat, work release 1 week; Recommended follow up- PCP or Ortho., 1 week  Flint Melter, MD 01/29/13 Jacinta Shoe

## 2013-01-28 NOTE — ED Notes (Signed)
Patient transported to X-ray 

## 2013-01-28 NOTE — ED Notes (Signed)
Pt presents w/ back pain following starting a new job that requires standing, lifting. Pt indicates that she is using proper body mechanics but is having increasing back pain resulting in her falling twice today. Pt has used multiple OTC NSAIDS and Tylenol, heat packs, and icy hot patches w/o any relief. Has also purchased insoles for her shoes.

## 2013-04-26 ENCOUNTER — Emergency Department (HOSPITAL_COMMUNITY)
Admission: EM | Admit: 2013-04-26 | Discharge: 2013-04-26 | Disposition: A | Discharge status: Home / Self Care | Payer: Self-pay | Attending: Emergency Medicine | Admitting: Emergency Medicine

## 2013-04-26 ENCOUNTER — Encounter (HOSPITAL_COMMUNITY): Payer: Self-pay

## 2013-04-26 DIAGNOSIS — R110 Nausea: Secondary | ICD-10-CM | POA: Insufficient documentation

## 2013-04-26 DIAGNOSIS — H538 Other visual disturbances: Secondary | ICD-10-CM | POA: Insufficient documentation

## 2013-04-26 DIAGNOSIS — H53149 Visual discomfort, unspecified: Secondary | ICD-10-CM | POA: Insufficient documentation

## 2013-04-26 DIAGNOSIS — I10 Essential (primary) hypertension: Secondary | ICD-10-CM | POA: Insufficient documentation

## 2013-04-26 DIAGNOSIS — F17200 Nicotine dependence, unspecified, uncomplicated: Secondary | ICD-10-CM | POA: Insufficient documentation

## 2013-04-26 DIAGNOSIS — Z8659 Personal history of other mental and behavioral disorders: Secondary | ICD-10-CM | POA: Insufficient documentation

## 2013-04-26 DIAGNOSIS — R519 Headache, unspecified: Secondary | ICD-10-CM

## 2013-04-26 DIAGNOSIS — Z860100 Personal history of colon polyps, unspecified: Secondary | ICD-10-CM | POA: Insufficient documentation

## 2013-04-26 DIAGNOSIS — Z87440 Personal history of urinary (tract) infections: Secondary | ICD-10-CM | POA: Insufficient documentation

## 2013-04-26 DIAGNOSIS — G43909 Migraine, unspecified, not intractable, without status migrainosus: Secondary | ICD-10-CM | POA: Insufficient documentation

## 2013-04-26 DIAGNOSIS — Z8719 Personal history of other diseases of the digestive system: Secondary | ICD-10-CM | POA: Insufficient documentation

## 2013-04-26 MED ORDER — METOCLOPRAMIDE HCL 5 MG/ML IJ SOLN
10.0000 mg | Freq: Once | INTRAMUSCULAR | Status: AC
  Administered 2013-04-26: 10 mg via INTRAVENOUS
  Filled 2013-04-26: qty 2

## 2013-04-26 MED ORDER — DIPHENHYDRAMINE HCL 50 MG/ML IJ SOLN
25.0000 mg | Freq: Once | INTRAMUSCULAR | Status: AC
  Administered 2013-04-26: 25 mg via INTRAVENOUS
  Filled 2013-04-26: qty 1

## 2013-04-26 NOTE — ED Provider Notes (Signed)
CSN: [id number]     Arrival date & time 04/26/13  1129 History     First MD Initiated Contact with Patient 04/26/13 1133     Chief Complaint  Patient presents with  . Migraine    Patient is a 47 y.o. female presenting with headaches. The history is provided by the patient.  Headache Pain location:  Frontal Quality:  Dull Radiates to:  Does not radiate Onset quality:  Gradual Duration:  5 days Timing:  Constant Progression:  Worsening Similar to prior headaches: yes   Relieved by:  Nothing Worsened by:  Light Associated symptoms: blurred vision, nausea and photophobia   Associated symptoms: no abdominal pain, no cough, no fever, no focal weakness, no numbness, no syncope and no vomiting   pt reports onset of HA similar to prior headaches No fall/trauma No fever No tick bites/rash No focal weakness No visual loss reported   Past Medical History  Diagnosis Date  . Hypertension   . Headache(784.0)     migraines  . Anxiety   . Depression   . Urinary tract infection   . Ectopic pregnancy   . Inflammatory polyps of colon   . Acid reflux   . Esophageal stricture   . Hiatal hernia    Past Surgical History  Procedure Laterality Date  . Ectopic pregnancy surgery    . Tonsillectomy    . Cholecystectomy    . Wisdom tooth extraction    . Ovarian cyst removal    . Mass removed from r buttock    . Laparoscopy      X 2 to remove scar tissue  . Appendectomy    . Polypectomy      polyps removed from colon during colonoscopy   Family History  Problem Relation Age of Onset  . Anesthesia problems Neg Hx    History  Substance Use Topics  . Smoking status: Current Every Day Smoker -- 0.50 packs/day for 1 years    Types: Cigarettes  . Smokeless tobacco: Never Used  . Alcohol Use: Yes     Comment: special events, wine or beer   OB History   Grav Para Term Preterm Abortions TAB SAB Ect Mult Living   6 4 4  0 2 0 1 1 0 4     Review of Systems  Constitutional: Negative  for fever.  Eyes: Positive for blurred vision and photophobia.  Respiratory: Negative for cough.   Cardiovascular: Negative for chest pain and syncope.  Gastrointestinal: Positive for nausea. Negative for vomiting and abdominal pain.  Neurological: Positive for headaches. Negative for focal weakness, weakness and numbness.  All other systems reviewed and are negative.    Allergies  Review of patient's allergies indicates no known allergies.  Home Medications   Current Outpatient Rx  Name  Route  Sig  Dispense  Refill  . diphenhydramine-acetaminophen (TYLENOL PM) 25-500 MG TABS   Oral   Take 3-5 tablets by mouth as needed (for migraine).           BP 151/72  Pulse 113  Temp(Src) 98.8 F (37.1 C) (Oral)  Resp 23  SpO2 94%  LMP 03/28/2013 Physical Exam CONSTITUTIONAL: Well developed/well nourished HEAD: Normocephalic/atraumatic EYES: EOMI/PERRL, no nystagmus ENMT: Mucous membranes moist NECK: supple no meningeal signs, no bruits SPINE:entire spine nontender CV: S1/S2 noted, no murmurs/rubs/gallops noted LUNGS: Lungs are clear to auscultation bilaterally, no apparent distress ABDOMEN: soft, nontender, no rebound or guarding GU:no cva tenderness NEURO:Awake/alert, facies symmetric, no arm or leg drift  is noted Cranial nerves 3/4/5/6/03/12/09/11/12 tested and intact Gait normal without ataxia No past pointing EXTREMITIES: pulses normal, full ROM SKIN: warm, color normal PSYCH: no abnormalities of mood noted    ED Course   Procedures  MDM  Nursing notes including past medical history and social history reviewed and considered in documentation  Doubt acute neurologic emergency at this time. Stable for d/c and followup as outpatient   Joya Gaskins, MD 04/26/13 1240

## 2013-04-26 NOTE — ED Notes (Signed)
She c/o typical migraine (left-sided); since this Monday.  She is in no distress. She denies fever/cough/vomiting or diarrhea.  She does c/o nausea.

## 2013-05-09 ENCOUNTER — Encounter (HOSPITAL_COMMUNITY): Payer: Self-pay

## 2013-05-09 ENCOUNTER — Emergency Department (HOSPITAL_COMMUNITY)
Admission: EM | Admit: 2013-05-09 | Discharge: 2013-05-09 | Disposition: A | Discharge status: Home / Self Care | Payer: Medicaid Other | Attending: Emergency Medicine | Admitting: Emergency Medicine

## 2013-05-09 DIAGNOSIS — I10 Essential (primary) hypertension: Secondary | ICD-10-CM | POA: Insufficient documentation

## 2013-05-09 DIAGNOSIS — F17200 Nicotine dependence, unspecified, uncomplicated: Secondary | ICD-10-CM | POA: Insufficient documentation

## 2013-05-09 DIAGNOSIS — Z8742 Personal history of other diseases of the female genital tract: Secondary | ICD-10-CM | POA: Insufficient documentation

## 2013-05-09 DIAGNOSIS — G43909 Migraine, unspecified, not intractable, without status migrainosus: Secondary | ICD-10-CM

## 2013-05-09 DIAGNOSIS — Z860100 Personal history of colon polyps, unspecified: Secondary | ICD-10-CM | POA: Insufficient documentation

## 2013-05-09 DIAGNOSIS — Z8659 Personal history of other mental and behavioral disorders: Secondary | ICD-10-CM | POA: Insufficient documentation

## 2013-05-09 DIAGNOSIS — Z87440 Personal history of urinary (tract) infections: Secondary | ICD-10-CM | POA: Insufficient documentation

## 2013-05-09 DIAGNOSIS — M542 Cervicalgia: Secondary | ICD-10-CM | POA: Insufficient documentation

## 2013-05-09 MED ORDER — DEXAMETHASONE SODIUM PHOSPHATE 10 MG/ML IJ SOLN
10.0000 mg | Freq: Once | INTRAMUSCULAR | Status: AC
  Administered 2013-05-09: 10 mg via INTRAVENOUS
  Filled 2013-05-09: qty 1

## 2013-05-09 MED ORDER — DIPHENHYDRAMINE HCL 50 MG/ML IJ SOLN
25.0000 mg | Freq: Once | INTRAMUSCULAR | Status: AC
  Administered 2013-05-09: 25 mg via INTRAVENOUS
  Filled 2013-05-09: qty 1

## 2013-05-09 MED ORDER — KETOROLAC TROMETHAMINE 30 MG/ML IJ SOLN
30.0000 mg | Freq: Once | INTRAMUSCULAR | Status: AC
  Administered 2013-05-09: 30 mg via INTRAVENOUS
  Filled 2013-05-09: qty 1

## 2013-05-09 MED ORDER — IBUPROFEN 600 MG PO TABS: 600.0000 mg | ORAL_TABLET | Freq: Four times a day (QID) | ORAL | Status: DC | PRN

## 2013-05-09 MED ORDER — METOCLOPRAMIDE HCL 5 MG/ML IJ SOLN
10.0000 mg | Freq: Once | INTRAMUSCULAR | Status: AC
  Administered 2013-05-09: 10 mg via INTRAVENOUS
  Filled 2013-05-09: qty 2

## 2013-05-09 MED ORDER — SODIUM CHLORIDE 0.9 % IV BOLUS (SEPSIS)
1000.0000 mL | Freq: Once | INTRAVENOUS | Status: AC
  Administered 2013-05-09: 1000 mL via INTRAVENOUS

## 2013-05-09 NOTE — ED Provider Notes (Signed)
CSN: [id number]     Arrival date & time 05/09/13  1549 History   First MD Initiated Contact with Patient 05/09/13 1625     Chief Complaint  Patient presents with  . Migraine   (Consider location/radiation/quality/duration/timing/severity/associated sxs/prior Treatment) HPI Comments: SUBJECTIVE: Bailey Russell is a 47 y.o. female who complains of headaches for 3 week(s). Description of pain: throbbing pain, unilateral in the left frontal area. Duration of individual headaches: constant. Associated symptoms: light sensitivity, nausea and visual disturbance. Pain relief: unable to obtain relief with OTC meds. Precipitating factors: patient is aware of none. She denies a history of recent head injury.  Prior neurological history: negative for stroke, brain neoplasms, seizure disorders, multiple sclerosis, AVM (arteriovenous malformation), meningitis, major head injuries, neurosurgical procedures. Neurologic Review of Systems - no amaurosis, diplopia, abnormal speech, unilateral numbness or weakness.  Patient has hx of Migraines, and was on maintenance therapy when she lived in [place]. Currently, she has no follow up, and she is not on any meds. She was seen with the same complains few days back. States that she came in today, as at one point, she had visual disturbance, people just looked abnormal. She has no visual complains at this time.  Scheduled Meds:dexamethasone, 10 mg, Intravenous, Once diphenhydrAMINE, 25 mg, Intravenous, Once ketorolac, 30 mg, Intravenous, Once metoCLOPramide (REGLAN) injection, 10 mg, Intravenous, Once   Continuous Infusions:sodium chloride   PRN Meds:   OBJECTIVE: Appearance: oriented to person, place, and time and overweight. Neurological Exam: alert, oriented, normal speech, no focal findings or movement disorder noted, neck supple without rigidity, motor and sensory grossly normal bilaterally.  ASSESSMENT: migraine - common.  PLAN: Recommendations: lie  in darkened room and apply cold packs prn for pain, side effect profile discussed in detail, asked to keep headache diary, patient reassured that neurodiagnostic workup not indicated from benign H & P and referral to Neurology. See orders for this visit as documented in the electronic medical record.   Patient is a 47 y.o. female presenting with migraines. The history is provided by the patient.  Migraine Associated symptoms include headaches. Pertinent negatives include no chest pain, no abdominal pain and no shortness of breath.    Past Medical History  Diagnosis Date  . Hypertension   . Headache(784.0)     migraines  . Anxiety   . Depression   . Urinary tract infection   . Ectopic pregnancy   . Inflammatory polyps of colon   . Acid reflux   . Esophageal stricture   . Hiatal hernia    Past Surgical History  Procedure Laterality Date  . Ectopic pregnancy surgery    . Tonsillectomy    . Cholecystectomy    . Wisdom tooth extraction    . Ovarian cyst removal    . Mass removed from r buttock    . Laparoscopy      X 2 to remove scar tissue  . Appendectomy    . Polypectomy      polyps removed from colon during colonoscopy   Family History  Problem Relation Age of Onset  . Anesthesia problems Neg Hx    History  Substance Use Topics  . Smoking status: Current Every Day Smoker -- 0.25 packs/day for 1 years    Types: Cigarettes  . Smokeless tobacco: Never Used  . Alcohol Use: Yes     Comment: special events, wine or beer   OB History   Grav Para Term Preterm Abortions TAB SAB Ect Mult Living  6 4 4  0 2 0 1 1 0 4     Review of Systems  Constitutional: Positive for activity change. Negative for fever and chills.  HENT: Positive for neck pain. Negative for neck stiffness.   Eyes: Positive for photophobia and visual disturbance. Negative for pain.  Respiratory: Negative for shortness of breath.   Cardiovascular: Negative for chest pain.  Gastrointestinal: Negative for  nausea, vomiting and abdominal pain.  Genitourinary: Negative for dysuria.  Skin: Negative for rash.  Neurological: Positive for headaches. Negative for dizziness, seizures, syncope, facial asymmetry, speech difficulty, weakness and numbness.    Allergies  Review of patient's allergies indicates no known allergies.  Home Medications   Current Outpatient Rx  Name  Route  Sig  Dispense  Refill  . diphenhydramine-acetaminophen (TYLENOL PM) 25-500 MG TABS   Oral   Take 3-5 tablets by mouth as needed (for migraine).           BP 169/94  Pulse 103  Temp(Src) 99.2 F (37.3 C) (Oral)  Resp 18  Ht 5\' 8"  (1.727 m)  Wt 305 lb (138.347 kg)  BMI 46.39 kg/m2  SpO2 97%  LMP 05/03/2013 Physical Exam  Constitutional: She is oriented to person, place, and time. She appears well-developed and well-nourished.  HENT:  Head: Normocephalic and atraumatic.  Eyes: EOM are normal. Pupils are equal, round, and reactive to light.  Neck: Neck supple.  Cardiovascular: Normal rate, regular rhythm and normal heart sounds.   No murmur heard. Pulmonary/Chest: Effort normal. No respiratory distress.  Abdominal: Soft. She exhibits no distension. There is no tenderness. There is no rebound and no guarding.  Neurological: She is alert and oriented to person, place, and time. No cranial nerve deficit. Coordination normal.  Skin: Skin is warm and dry.    ED Course  Procedures (including critical care time) Labs Review Labs Reviewed - No data to display Imaging Review No results found.  MDM  No diagnosis found.  DDX includes: Primary headaches - including migrainous headaches, cluster headaches, tension headaches. ICH Carotid dissection Cavernous sinus thrombosis Meningitis Encephalitis Sinusitis Tumor Vascular headaches AV malformation Brain aneurysm Muscular headaches  A/P: Pt comes in with cc of headaches. No concerns for life threatening secondary headaches because the current  headaches are similar to her previous migrainous headaches, and have been persistent for 3 weeks now, and she has no focal neuro deficits, no signs of infection and no family hx of brain aneurysms or tumors.  Will give headache meds.    9:50 PM Completely headache free, will d/c now.  Derwood Kaplan, MD 05/09/13 2150

## 2013-05-09 NOTE — ED Notes (Signed)
C/O migraine x 3 weeks.  Has been taking her meds, then came in here a week ago for treatment. States the "concoction" here took the edge off.  States the pain returned this am and now her vision is starting to blur.  C/o photosensitivity and sensitivity to sound and movement.  Also c/o nausea.

## 2013-05-26 ENCOUNTER — Ambulatory Visit: Payer: Self-pay | Attending: Internal Medicine | Admitting: Internal Medicine

## 2013-05-26 ENCOUNTER — Encounter: Payer: Self-pay | Admitting: Internal Medicine

## 2013-05-26 VITALS — BP 155/94 | HR 81 | Temp 98.4°F | Resp 16 | Ht 68.11 in | Wt 311.0 lb

## 2013-05-26 DIAGNOSIS — I10 Essential (primary) hypertension: Secondary | ICD-10-CM | POA: Insufficient documentation

## 2013-05-26 DIAGNOSIS — Z09 Encounter for follow-up examination after completed treatment for conditions other than malignant neoplasm: Secondary | ICD-10-CM | POA: Insufficient documentation

## 2013-05-26 DIAGNOSIS — Z79899 Other long term (current) drug therapy: Secondary | ICD-10-CM | POA: Insufficient documentation

## 2013-05-26 MED ORDER — CLONAZEPAM 0.5 MG PO TABS: 0.5000 mg | ORAL_TABLET | Freq: Two times a day (BID) | ORAL | Status: DC | PRN

## 2013-05-26 MED ORDER — HYDROCHLOROTHIAZIDE 25 MG PO TABS: 25.0000 mg | ORAL_TABLET | Freq: Every day | ORAL | Status: DC

## 2013-05-26 MED ORDER — ONDANSETRON HCL 4 MG PO TABS: 4.0000 mg | ORAL_TABLET | Freq: Three times a day (TID) | ORAL | Status: DC | PRN

## 2013-05-26 MED ORDER — CITALOPRAM HYDROBROMIDE 20 MG PO TABS: 20.0000 mg | ORAL_TABLET | Freq: Every day | ORAL | Status: DC

## 2013-05-26 MED ORDER — TOPIRAMATE 100 MG PO TABS: 100.0000 mg | ORAL_TABLET | Freq: Two times a day (BID) | ORAL | Status: DC

## 2013-05-26 NOTE — Progress Notes (Signed)
Pt is here to est care C/o migraines headaches and HTN BP today is 155/94... Reports she has a Rx for HCTZ 25 mg but has not filled it yet Also states she was taken off Lisinopril due to coughing s/e and was given amlodipine Pt smoked 1 cig before coming in to appt She is alert w/no signs of acute distress.

## 2013-05-26 NOTE — Patient Instructions (Signed)

## 2013-05-26 NOTE — Progress Notes (Signed)
Patient ID: Bailey Russell, female   DOB: 1966-03-11, 47 y.o.   MRN: [id number]   CC:  Follow up   HPI: Pt is 47 yo female with HTN but not on any medication since she has lost her insurance, presents to clinic for BP check. She denies chest pain or shortness of breath, no specific abdominal or urinary concerns, no recent sicknesses. Pt wants to know if she needs medication for BP. She used to be on HCTZ.  No Known Allergies Past Medical History  Diagnosis Date  . Hypertension   . Headache(784.0)     migraines  . Anxiety   . Depression   . Urinary tract infection   . Ectopic pregnancy   . Inflammatory polyps of colon   . Acid reflux   . Esophageal stricture   . Hiatal hernia    Current Outpatient Prescriptions on File Prior to Visit  Medication Sig Dispense Refill  . diphenhydramine-acetaminophen (TYLENOL PM) 25-500 MG TABS Take 3-5 tablets by mouth as needed (for migraine).       Marland Kitchen ibuprofen (ADVIL,MOTRIN) 600 MG tablet Take 1 tablet (600 mg total) by mouth every 6 (six) hours as needed for pain.  30 tablet  0   No current facility-administered medications on file prior to visit.   Family History  Problem Relation Age of Onset  . Anesthesia problems Neg Hx    History   Social History  . Marital Status: Widowed    Spouse Name: N/A    Number of Children: N/A  . Years of Education: N/A   Occupational History  . Not on file.   Social History Main Topics  . Smoking status: Current Every Day Smoker -- 0.50 packs/day for 1 years    Types: Cigarettes  . Smokeless tobacco: Never Used  . Alcohol Use: Yes     Comment: special events, wine or beer  . Drug Use: No  . Sexual Activity: No     Comment: Last intercourse over 1 month ago   Other Topics Concern  . Not on file   Social History Narrative  . No narrative on file    Review of Systems  Constitutional: Negative for fever, chills, diaphoresis, activity change, appetite change and fatigue.  HENT: Negative for  ear pain, nosebleeds, congestion, facial swelling, rhinorrhea, neck pain, neck stiffness and ear discharge.   Eyes: Negative for pain, discharge, redness, itching and visual disturbance.  Respiratory: Negative for cough, choking, chest tightness, shortness of breath, wheezing and stridor.   Cardiovascular: Negative for chest pain, palpitations and leg swelling.  Gastrointestinal: Negative for abdominal distention.  Genitourinary: Negative for dysuria, urgency, frequency, hematuria, flank pain, decreased urine volume, difficulty urinating and dyspareunia.  Musculoskeletal: Negative for back pain, joint swelling, arthralgias and gait problem.  Neurological: Negative for dizziness, tremors, seizures, syncope, facial asymmetry, speech difficulty, weakness, light-headedness, numbness and headaches.  Hematological: Negative for adenopathy. Does not bruise/bleed easily.  Psychiatric/Behavioral: Negative for hallucinations, behavioral problems, confusion, dysphoric mood, decreased concentration and agitation.    Objective:   Filed Vitals:   05/26/13 1212  BP: 155/94  Pulse: 81  Temp: 98.4 F (36.9 C)  Resp: 16    Physical Exam  Constitutional: Appears well-developed and well-nourished. No distress.  CVS: RRR, S1/S2 +, no murmurs, no gallops, no carotid bruit.  Pulmonary: Effort and breath sounds normal, no stridor, rhonchi, wheezes, rales.  Abdominal: Soft. BS +,  no distension, tenderness, rebound or guarding.  Musculoskeletal: Normal range of motion. No edema and  no tenderness.    Lab Results  Component Value Date   WBC 7.6 09/03/2012   HGB 12.7 09/03/2012   HCT 38.4 09/03/2012   MCV 93.4 09/03/2012   PLT 354 09/03/2012   Lab Results  Component Value Date   CREATININE 0.86 09/03/2012   BUN 9 09/03/2012   NA 140 09/03/2012   K 3.6 09/03/2012   CL 103 09/03/2012   CO2 28 09/03/2012    No results found for this basename: HGBA1C   Lipid Panel  No results found for this  basename: chol, trig, hdl, cholhdl, vldl, ldlcalc       Assessment and plan:   Patient Active Problem List   Diagnosis Date Noted  . Blood pressure elevated - will place back on HCTZ, pt advised to check her BP regularly and to call us back is the numbers are > 140/90. Will need to check electrolyte panel on the next visit.  07/03/2012

## 2013-06-16 ENCOUNTER — Encounter (HOSPITAL_COMMUNITY): Payer: Self-pay | Admitting: Emergency Medicine

## 2013-06-16 ENCOUNTER — Emergency Department (HOSPITAL_COMMUNITY): Payer: No Typology Code available for payment source

## 2013-06-16 ENCOUNTER — Emergency Department (HOSPITAL_COMMUNITY)
Admission: EM | Admit: 2013-06-16 | Discharge: 2013-06-16 | Disposition: A | Discharge status: Home / Self Care | Payer: No Typology Code available for payment source | Attending: Emergency Medicine | Admitting: Emergency Medicine

## 2013-06-16 DIAGNOSIS — S4980XA Other specified injuries of shoulder and upper arm, unspecified arm, initial encounter: Secondary | ICD-10-CM | POA: Insufficient documentation

## 2013-06-16 DIAGNOSIS — IMO0002 Reserved for concepts with insufficient information to code with codable children: Secondary | ICD-10-CM | POA: Insufficient documentation

## 2013-06-16 DIAGNOSIS — S298XXA Other specified injuries of thorax, initial encounter: Secondary | ICD-10-CM | POA: Insufficient documentation

## 2013-06-16 DIAGNOSIS — F3289 Other specified depressive episodes: Secondary | ICD-10-CM | POA: Insufficient documentation

## 2013-06-16 DIAGNOSIS — S6990XA Unspecified injury of unspecified wrist, hand and finger(s), initial encounter: Secondary | ICD-10-CM | POA: Insufficient documentation

## 2013-06-16 DIAGNOSIS — Z860100 Personal history of colon polyps, unspecified: Secondary | ICD-10-CM | POA: Insufficient documentation

## 2013-06-16 DIAGNOSIS — F17200 Nicotine dependence, unspecified, uncomplicated: Secondary | ICD-10-CM | POA: Insufficient documentation

## 2013-06-16 DIAGNOSIS — F329 Major depressive disorder, single episode, unspecified: Secondary | ICD-10-CM | POA: Insufficient documentation

## 2013-06-16 DIAGNOSIS — Z87440 Personal history of urinary (tract) infections: Secondary | ICD-10-CM | POA: Insufficient documentation

## 2013-06-16 DIAGNOSIS — S0993XA Unspecified injury of face, initial encounter: Secondary | ICD-10-CM | POA: Insufficient documentation

## 2013-06-16 DIAGNOSIS — Y9389 Activity, other specified: Secondary | ICD-10-CM | POA: Insufficient documentation

## 2013-06-16 DIAGNOSIS — S46909A Unspecified injury of unspecified muscle, fascia and tendon at shoulder and upper arm level, unspecified arm, initial encounter: Secondary | ICD-10-CM | POA: Insufficient documentation

## 2013-06-16 DIAGNOSIS — Y92410 Unspecified street and highway as the place of occurrence of the external cause: Secondary | ICD-10-CM | POA: Insufficient documentation

## 2013-06-16 DIAGNOSIS — I10 Essential (primary) hypertension: Secondary | ICD-10-CM | POA: Insufficient documentation

## 2013-06-16 DIAGNOSIS — S59909A Unspecified injury of unspecified elbow, initial encounter: Secondary | ICD-10-CM | POA: Insufficient documentation

## 2013-06-16 DIAGNOSIS — Z8719 Personal history of other diseases of the digestive system: Secondary | ICD-10-CM | POA: Insufficient documentation

## 2013-06-16 DIAGNOSIS — S8990XA Unspecified injury of unspecified lower leg, initial encounter: Secondary | ICD-10-CM | POA: Insufficient documentation

## 2013-06-16 DIAGNOSIS — F411 Generalized anxiety disorder: Secondary | ICD-10-CM | POA: Insufficient documentation

## 2013-06-16 DIAGNOSIS — Z79899 Other long term (current) drug therapy: Secondary | ICD-10-CM | POA: Insufficient documentation

## 2013-06-16 MED ORDER — HYDROCODONE-ACETAMINOPHEN 5-325 MG PO TABS
2.0000 | ORAL_TABLET | Freq: Once | ORAL | Status: AC
  Administered 2013-06-16: 2 via ORAL
  Filled 2013-06-16: qty 2

## 2013-06-16 MED ORDER — HYDROCODONE-ACETAMINOPHEN 5-325 MG PO TABS: 2.0000 | ORAL_TABLET | ORAL | Status: DC | PRN

## 2013-06-16 MED ORDER — HYDROCODONE-ACETAMINOPHEN 5-325 MG PO TABS
1.0000 | ORAL_TABLET | Freq: Once | ORAL | Status: AC
  Administered 2013-06-16: 1 via ORAL
  Filled 2013-06-16: qty 1

## 2013-06-16 NOTE — ED Notes (Signed)
Ortho tech repaged 

## 2013-06-16 NOTE — ED Notes (Signed)
[employer] paged for crutches. Pt alert, NAD, calm, interactive, resps e/u, speaking in clear complete sentences. Family at [facility]. C/o pain.

## 2013-06-16 NOTE — ED Notes (Signed)
Pt back from radiology. Used the bedpan.

## 2013-06-16 NOTE — Progress Notes (Signed)
Orthopedic Tech Progress Note Patient Details:  Bailey Russell August 08, 1966 [id number]  Ortho Devices Type of Ortho Device: Crutches Ortho Device/Splint Interventions: Ordered;Adjustment   [name] 06/16/2013, 8:18 PM

## 2013-06-16 NOTE — ED Notes (Signed)
Crutches explained by and given by [employer].
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
## 2013-06-19 ENCOUNTER — Ambulatory Visit: Payer: No Typology Code available for payment source | Attending: Internal Medicine | Admitting: Internal Medicine

## 2013-06-19 ENCOUNTER — Encounter: Payer: Self-pay | Admitting: Internal Medicine

## 2013-06-19 ENCOUNTER — Ambulatory Visit (HOSPITAL_COMMUNITY)
Admission: RE | Admit: 2013-06-19 | Discharge: 2013-06-19 | Disposition: A | Discharge status: Home / Self Care | Payer: No Typology Code available for payment source | Source: Ambulatory Visit | Attending: Internal Medicine | Admitting: Internal Medicine

## 2013-06-19 VITALS — BP 165/112 | HR 86 | Temp 99.0°F | Resp 16 | Ht 68.0 in | Wt 310.0 lb

## 2013-06-19 DIAGNOSIS — M25559 Pain in unspecified hip: Secondary | ICD-10-CM | POA: Insufficient documentation

## 2013-06-19 DIAGNOSIS — I10 Essential (primary) hypertension: Secondary | ICD-10-CM | POA: Insufficient documentation

## 2013-06-19 DIAGNOSIS — M1710 Unilateral primary osteoarthritis, unspecified knee: Secondary | ICD-10-CM | POA: Insufficient documentation

## 2013-06-19 DIAGNOSIS — M25569 Pain in unspecified knee: Secondary | ICD-10-CM

## 2013-06-19 DIAGNOSIS — M25469 Effusion, unspecified knee: Secondary | ICD-10-CM | POA: Insufficient documentation

## 2013-06-19 DIAGNOSIS — M79609 Pain in unspecified limb: Secondary | ICD-10-CM | POA: Insufficient documentation

## 2013-06-19 DIAGNOSIS — M25561 Pain in right knee: Secondary | ICD-10-CM

## 2013-06-19 DIAGNOSIS — I998 Other disorder of circulatory system: Secondary | ICD-10-CM | POA: Insufficient documentation

## 2013-06-19 MED ORDER — HYDROCODONE-ACETAMINOPHEN 5-325 MG PO TABS: 2.0000 | ORAL_TABLET | Freq: Four times a day (QID) | ORAL | Status: DC | PRN
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
## 2013-07-04 ENCOUNTER — Ambulatory Visit: Payer: No Typology Code available for payment source | Attending: Internal Medicine | Admitting: Internal Medicine

## 2013-07-04 ENCOUNTER — Ambulatory Visit (HOSPITAL_COMMUNITY)
Admission: RE | Admit: 2013-07-04 | Discharge: 2013-07-04 | Disposition: A | Discharge status: Home / Self Care | Payer: No Typology Code available for payment source | Source: Ambulatory Visit | Attending: Internal Medicine | Admitting: Internal Medicine

## 2013-07-04 VITALS — BP 159/98 | HR 81 | Temp 98.0°F | Resp 17 | Wt 314.4 lb

## 2013-07-04 DIAGNOSIS — M25569 Pain in unspecified knee: Secondary | ICD-10-CM | POA: Insufficient documentation

## 2013-07-04 DIAGNOSIS — R030 Elevated blood-pressure reading, without diagnosis of hypertension: Secondary | ICD-10-CM

## 2013-07-04 DIAGNOSIS — M7989 Other specified soft tissue disorders: Secondary | ICD-10-CM

## 2013-07-04 DIAGNOSIS — M25579 Pain in unspecified ankle and joints of unspecified foot: Secondary | ICD-10-CM | POA: Insufficient documentation

## 2013-07-04 DIAGNOSIS — M25571 Pain in right ankle and joints of right foot: Secondary | ICD-10-CM

## 2013-07-04 DIAGNOSIS — IMO0001 Reserved for inherently not codable concepts without codable children: Secondary | ICD-10-CM

## 2013-07-04 MED ORDER — HYDROCHLOROTHIAZIDE 25 MG PO TABS: 25.0000 mg | ORAL_TABLET | Freq: Every day | ORAL | Status: DC

## 2013-07-04 MED ORDER — AMLODIPINE BESYLATE 5 MG PO TABS: 5.0000 mg | ORAL_TABLET | Freq: Every day | ORAL | Status: DC
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
## 2013-07-04 NOTE — Progress Notes (Signed)
Bailey Russell from vascular lab call Patient is negative for any clots

## 2013-07-04 NOTE — Progress Notes (Signed)
Right lower extremity venous duplex completed.  Right:  No evidence of DVT, superficial thrombosis, or Baker's cyst.  Left:  Negative for DVT in the common femoral vein.  

## 2013-07-07 ENCOUNTER — Ambulatory Visit (INDEPENDENT_AMBULATORY_CARE_PROVIDER_SITE_OTHER): Payer: Self-pay | Admitting: Family Medicine

## 2013-07-07 ENCOUNTER — Encounter: Payer: Self-pay | Admitting: Family Medicine

## 2013-07-07 VITALS — BP 133/81 | HR 93 | Ht 68.0 in | Wt 312.0 lb

## 2013-07-07 DIAGNOSIS — S8000XA Contusion of unspecified knee, initial encounter: Secondary | ICD-10-CM

## 2013-07-07 DIAGNOSIS — S8001XA Contusion of right knee, initial encounter: Secondary | ICD-10-CM

## 2013-07-07 DIAGNOSIS — S39012A Strain of muscle, fascia and tendon of lower back, initial encounter: Secondary | ICD-10-CM

## 2013-07-07 DIAGNOSIS — S335XXA Sprain of ligaments of lumbar spine, initial encounter: Secondary | ICD-10-CM

## 2013-07-07 DIAGNOSIS — S93401A Sprain of unspecified ligament of right ankle, initial encounter: Secondary | ICD-10-CM

## 2013-07-07 DIAGNOSIS — M549 Dorsalgia, unspecified: Secondary | ICD-10-CM

## 2013-07-07 DIAGNOSIS — S93409A Sprain of unspecified ligament of unspecified ankle, initial encounter: Secondary | ICD-10-CM

## 2013-07-07 MED ORDER — CYCLOBENZAPRINE HCL 10 MG PO TABS: 10.0000 mg | ORAL_TABLET | Freq: Three times a day (TID) | ORAL | Status: DC | PRN

## 2013-07-07 NOTE — Patient Instructions (Signed)
You have a lumbar strain of your back. Take tylenol for baseline pain relief (1-2 extra strength tabs 3x/day) Ibuprofen 600mg  three times a day with food for pain and inflammation. Norco as needed for severe pain. Flexeril as needed for muscle spasms (no driving on this medicine if it makes you sleepy). Stay as active as possible. Physical therapy has been shown to be helpful as well - star this and do home exercises on days you don't go to therapy. Strengthening of low back muscles, abdominal musculature are key for long term pain relief. If not improving, will consider further imaging (MRI).  You have an ankle sprain. Ice the area for 15 minutes at a time, 3-4 times a day Ibuprofen 600mg  three times a day with food for pain and inflammation. Elevate above the level of your heart when possible Use laceup ankle brace to help with stability while you recover from this injury. Come out of the brace twice a day to do Up/down and alphabet exercises 2-3 sets of each. Start theraband strengthening exercises when directed - once a day 3 sets of 10. Start physical therapy for strengthening and balance exercises. If not improving as expected, we may repeat x-rays or consider further testing like an MRI.  You have a knee contusion, possibly flared up the arthritis in your knee. Physical therapy for this as well. Medicines as noted above. Knee brace if knee feels more stable with this.

## 2013-07-09 ENCOUNTER — Encounter: Payer: Self-pay | Admitting: Family Medicine

## 2013-07-09 ENCOUNTER — Ambulatory Visit: Payer: No Typology Code available for payment source | Attending: Family Medicine | Admitting: Rehabilitation

## 2013-07-09 DIAGNOSIS — M549 Dorsalgia, unspecified: Secondary | ICD-10-CM | POA: Insufficient documentation

## 2013-07-09 DIAGNOSIS — IMO0001 Reserved for inherently not codable concepts without codable children: Secondary | ICD-10-CM | POA: Insufficient documentation

## 2013-07-09 DIAGNOSIS — Y929 Unspecified place or not applicable: Secondary | ICD-10-CM | POA: Insufficient documentation

## 2013-07-09 DIAGNOSIS — S93409A Sprain of unspecified ligament of unspecified ankle, initial encounter: Secondary | ICD-10-CM | POA: Insufficient documentation

## 2013-07-09 DIAGNOSIS — S8991XA Unspecified injury of right lower leg, initial encounter: Secondary | ICD-10-CM | POA: Insufficient documentation

## 2013-07-09 DIAGNOSIS — X58XXXA Exposure to other specified factors, initial encounter: Secondary | ICD-10-CM | POA: Insufficient documentation

## 2013-07-09 DIAGNOSIS — M545 Low back pain, unspecified: Secondary | ICD-10-CM | POA: Insufficient documentation
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
## 2013-07-15 ENCOUNTER — Ambulatory Visit: Payer: No Typology Code available for payment source | Admitting: Rehabilitation

## 2013-07-17 ENCOUNTER — Ambulatory Visit: Payer: No Typology Code available for payment source | Admitting: Rehabilitation

## 2013-07-21 ENCOUNTER — Ambulatory Visit: Payer: No Typology Code available for payment source | Admitting: Rehabilitation

## 2013-07-23 ENCOUNTER — Ambulatory Visit: Payer: Medicaid Other

## 2013-07-23 ENCOUNTER — Ambulatory Visit: Payer: No Typology Code available for payment source | Admitting: Rehabilitation

## 2013-07-28 ENCOUNTER — Ambulatory Visit: Payer: No Typology Code available for payment source | Admitting: Rehabilitation

## 2013-07-30 ENCOUNTER — Ambulatory Visit: Payer: No Typology Code available for payment source | Admitting: Rehabilitation

## 2013-08-04 ENCOUNTER — Ambulatory Visit: Payer: No Typology Code available for payment source | Admitting: Rehabilitation

## 2013-08-06 ENCOUNTER — Ambulatory Visit: Payer: No Typology Code available for payment source | Attending: Family Medicine | Admitting: Rehabilitation

## 2013-08-06 DIAGNOSIS — R609 Edema, unspecified: Secondary | ICD-10-CM | POA: Insufficient documentation

## 2013-08-06 DIAGNOSIS — M25569 Pain in unspecified knee: Secondary | ICD-10-CM | POA: Insufficient documentation

## 2013-08-06 DIAGNOSIS — IMO0001 Reserved for inherently not codable concepts without codable children: Secondary | ICD-10-CM | POA: Insufficient documentation

## 2013-08-06 DIAGNOSIS — M25579 Pain in unspecified ankle and joints of unspecified foot: Secondary | ICD-10-CM | POA: Insufficient documentation

## 2013-08-08 ENCOUNTER — Ambulatory Visit: Payer: No Typology Code available for payment source | Admitting: Rehabilitation

## 2013-08-11 ENCOUNTER — Ambulatory Visit: Payer: No Typology Code available for payment source | Admitting: Rehabilitation

## 2013-08-14 ENCOUNTER — Ambulatory Visit: Payer: No Typology Code available for payment source | Admitting: Rehabilitation

## 2013-08-18 ENCOUNTER — Ambulatory Visit: Payer: No Typology Code available for payment source | Admitting: Rehabilitation

## 2013-08-18 ENCOUNTER — Encounter: Payer: Self-pay | Admitting: Family Medicine

## 2013-08-18 ENCOUNTER — Ambulatory Visit (INDEPENDENT_AMBULATORY_CARE_PROVIDER_SITE_OTHER): Payer: Self-pay | Admitting: Family Medicine

## 2013-08-18 ENCOUNTER — Ambulatory Visit: Payer: Self-pay | Admitting: Family Medicine

## 2013-08-18 VITALS — BP 150/102 | HR 94 | Ht 68.0 in | Wt 310.0 lb

## 2013-08-18 DIAGNOSIS — M545 Low back pain, unspecified: Secondary | ICD-10-CM

## 2013-08-18 DIAGNOSIS — M25561 Pain in right knee: Secondary | ICD-10-CM

## 2013-08-18 DIAGNOSIS — S8991XD Unspecified injury of right lower leg, subsequent encounter: Secondary | ICD-10-CM

## 2013-08-18 DIAGNOSIS — S93409A Sprain of unspecified ligament of unspecified ankle, initial encounter: Secondary | ICD-10-CM

## 2013-08-18 DIAGNOSIS — M25569 Pain in unspecified knee: Secondary | ICD-10-CM

## 2013-08-18 DIAGNOSIS — Z5189 Encounter for other specified aftercare: Secondary | ICD-10-CM

## 2013-08-18 MED ORDER — DIAZEPAM 5 MG PO TABS: ORAL_TABLET | ORAL | Status: DC

## 2013-08-19 ENCOUNTER — Ambulatory Visit (INDEPENDENT_AMBULATORY_CARE_PROVIDER_SITE_OTHER): Payer: Self-pay

## 2013-08-19 DIAGNOSIS — X58XXXA Exposure to other specified factors, initial encounter: Secondary | ICD-10-CM

## 2013-08-19 DIAGNOSIS — M545 Low back pain, unspecified: Secondary | ICD-10-CM

## 2013-08-19 DIAGNOSIS — D259 Leiomyoma of uterus, unspecified: Secondary | ICD-10-CM

## 2013-08-19 DIAGNOSIS — M7120 Synovial cyst of popliteal space [Baker], unspecified knee: Secondary | ICD-10-CM

## 2013-08-19 DIAGNOSIS — M25561 Pain in right knee: Secondary | ICD-10-CM

## 2013-08-19 DIAGNOSIS — M25469 Effusion, unspecified knee: Secondary | ICD-10-CM

## 2013-08-19 DIAGNOSIS — S83289A Other tear of lateral meniscus, current injury, unspecified knee, initial encounter: Secondary | ICD-10-CM

## 2013-08-19 DIAGNOSIS — M1710 Unilateral primary osteoarthritis, unspecified knee: Secondary | ICD-10-CM

## 2013-08-20 ENCOUNTER — Ambulatory Visit: Payer: No Typology Code available for payment source | Admitting: Rehabilitation

## 2013-08-22 ENCOUNTER — Encounter: Payer: Self-pay | Admitting: Family Medicine
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
## 2013-08-22 NOTE — Addendum Note (Signed)
Addended by: Lenda Kelp on: 08/22/2013 11:53 AM   Modules accepted: Orders
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
## 2013-08-25 ENCOUNTER — Ambulatory Visit: Payer: No Typology Code available for payment source | Admitting: Rehabilitation

## 2013-08-26 ENCOUNTER — Encounter: Payer: Self-pay | Admitting: Rehabilitation

## 2013-09-02 ENCOUNTER — Encounter: Payer: Self-pay | Admitting: Rehabilitation

## 2013-09-05 ENCOUNTER — Emergency Department (HOSPITAL_COMMUNITY)
Admission: EM | Admit: 2013-09-05 | Discharge: 2013-09-05 | Disposition: A | Discharge status: Home / Self Care | Payer: Medicaid Other | Attending: Emergency Medicine | Admitting: Emergency Medicine

## 2013-09-05 ENCOUNTER — Encounter (HOSPITAL_COMMUNITY): Payer: Self-pay | Admitting: Emergency Medicine

## 2013-09-05 ENCOUNTER — Encounter: Payer: Self-pay | Admitting: Rehabilitation

## 2013-09-05 DIAGNOSIS — R52 Pain, unspecified: Secondary | ICD-10-CM | POA: Insufficient documentation

## 2013-09-05 DIAGNOSIS — R6889 Other general symptoms and signs: Secondary | ICD-10-CM

## 2013-09-05 DIAGNOSIS — I10 Essential (primary) hypertension: Secondary | ICD-10-CM | POA: Insufficient documentation

## 2013-09-05 DIAGNOSIS — Z87440 Personal history of urinary (tract) infections: Secondary | ICD-10-CM | POA: Insufficient documentation

## 2013-09-05 DIAGNOSIS — Z860100 Personal history of colon polyps, unspecified: Secondary | ICD-10-CM | POA: Insufficient documentation

## 2013-09-05 DIAGNOSIS — J111 Influenza due to unidentified influenza virus with other respiratory manifestations: Secondary | ICD-10-CM | POA: Insufficient documentation

## 2013-09-05 DIAGNOSIS — F3289 Other specified depressive episodes: Secondary | ICD-10-CM | POA: Insufficient documentation

## 2013-09-05 DIAGNOSIS — G43909 Migraine, unspecified, not intractable, without status migrainosus: Secondary | ICD-10-CM | POA: Insufficient documentation

## 2013-09-05 DIAGNOSIS — IMO0001 Reserved for inherently not codable concepts without codable children: Secondary | ICD-10-CM | POA: Insufficient documentation

## 2013-09-05 DIAGNOSIS — R5381 Other malaise: Secondary | ICD-10-CM | POA: Insufficient documentation

## 2013-09-05 DIAGNOSIS — F411 Generalized anxiety disorder: Secondary | ICD-10-CM | POA: Insufficient documentation

## 2013-09-05 DIAGNOSIS — F329 Major depressive disorder, single episode, unspecified: Secondary | ICD-10-CM | POA: Insufficient documentation

## 2013-09-05 DIAGNOSIS — Z79899 Other long term (current) drug therapy: Secondary | ICD-10-CM | POA: Insufficient documentation

## 2013-09-05 DIAGNOSIS — F17200 Nicotine dependence, unspecified, uncomplicated: Secondary | ICD-10-CM | POA: Insufficient documentation

## 2013-09-05 DIAGNOSIS — Z9089 Acquired absence of other organs: Secondary | ICD-10-CM | POA: Insufficient documentation

## 2013-09-05 DIAGNOSIS — R5383 Other fatigue: Secondary | ICD-10-CM

## 2013-09-05 DIAGNOSIS — Z8719 Personal history of other diseases of the digestive system: Secondary | ICD-10-CM | POA: Insufficient documentation

## 2013-09-05 DIAGNOSIS — J029 Acute pharyngitis, unspecified: Secondary | ICD-10-CM | POA: Insufficient documentation

## 2013-09-05 MED ORDER — OSELTAMIVIR PHOSPHATE 75 MG PO CAPS: 75.0000 mg | ORAL_CAPSULE | Freq: Two times a day (BID) | ORAL | Status: DC
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
## 2013-09-05 NOTE — ED Provider Notes (Signed)
CSN: [id number]     Arrival date & time 09/05/13  3086 History   First MD Initiated Contact with Patient 09/05/13 [phone]     Chief Complaint  Patient presents with  . flu like symptoms    (Consider location/radiation/quality/duration/timing/severity/associated sxs/prior Treatment) HPI Pt is a 48yo female presenting with 2-3 day hx of flu-like symptoms including generalized body aches, fever, n/v/d. Tmax: 102. Reports using motrin around 7am this morning for fever and body aches. Pt has also been using a medication at home for nausea but cannot recall name of medication, states it does not help, however has not vomited in last 24hours.  Reports episodes of loose stool but no diarrhea. States her children are sick with the same symptoms. Denies hx of asthma but states she has had an inhaler in the past.  Pt is a smoker.  Past Medical History  Diagnosis Date  . Hypertension   . Headache(784.0)     migraines  . Anxiety   . Depression   . Urinary tract infection   . Ectopic pregnancy   . Inflammatory polyps of colon   . Acid reflux   . Esophageal stricture   . Hiatal hernia    Past Surgical History  Procedure Laterality Date  . Ectopic pregnancy surgery    . Tonsillectomy    . Cholecystectomy    . Wisdom tooth extraction    . Ovarian cyst removal    . Mass removed from r buttock    . Laparoscopy      X 2 to remove scar tissue  . Appendectomy    . Polypectomy      polyps removed from colon during colonoscopy   Family History  Problem Relation Age of Onset  . Anesthesia problems Neg Hx   . Diabetes Neg Hx   . Heart attack Neg Hx   . Hyperlipidemia Neg Hx   . Sudden death Neg Hx   . Hypertension Mother   . Hypertension Sister   . Hypertension Brother    History  Substance Use Topics  . Smoking status: Current Every Day Smoker -- 0.50 packs/day for 1 years    Types: Cigarettes  . Smokeless tobacco: Never Used  . Alcohol Use: Yes     Comment: special events, wine or beer    OB History   Grav Para Term Preterm Abortions TAB SAB Ect Mult Living   6 4 4  0 2 0 1 1 0 4     Review of Systems  Constitutional: Positive for fever, chills, appetite change and fatigue. Negative for diaphoresis and unexpected weight change.  HENT: Positive for congestion and sore throat. Negative for trouble swallowing and voice change.   Respiratory: Positive for cough. Negative for shortness of breath.   Cardiovascular: Negative for chest pain.  Gastrointestinal: Positive for nausea and vomiting. Negative for abdominal pain and diarrhea.  Musculoskeletal: Positive for myalgias.  All other systems reviewed and are negative.    Allergies  Review of patient's allergies indicates no known allergies.  Home Medications   Current Outpatient Rx  Name  Route  Sig  Dispense  Refill  . amLODipine (NORVASC) 5 MG tablet   Oral   Take 1 tablet (5 mg total) by mouth daily.   90 tablet   3   . citalopram (CELEXA) 20 MG tablet   Oral   Take 1 tablet (20 mg total) by mouth daily.   30 tablet   3   . clonazePAM (KLONOPIN) 0.5 MG  tablet   Oral   Take 1 tablet (0.5 mg total) by mouth 2 (two) times daily as needed for anxiety.   60 tablet   1   . cyclobenzaprine (FLEXERIL) 10 MG tablet   Oral   Take 1 tablet (10 mg total) by mouth every 8 (eight) hours as needed for muscle spasms.   90 tablet   0   . diphenhydramine-acetaminophen (TYLENOL PM) 25-500 MG TABS   Oral   Take 3-5 tablets by mouth as needed (for migraine).          . hydrochlorothiazide (HYDRODIURIL) 25 MG tablet   Oral   Take 1 tablet (25 mg total) by mouth daily.   90 tablet   3   . HYDROcodone-acetaminophen (NORCO) 5-325 MG per tablet   Oral   Take 2 tablets by mouth every 6 (six) hours as needed for pain.   30 tablet   0   . ibuprofen (ADVIL,MOTRIN) 600 MG tablet   Oral   Take 1 tablet (600 mg total) by mouth every 6 (six) hours as needed for pain.   30 tablet   0   . oseltamivir (TAMIFLU) 75 MG  capsule   Oral   Take 1 capsule (75 mg total) by mouth every 12 (twelve) hours.   10 capsule   0   . topiramate (TOPAMAX) 100 MG tablet   Oral   Take 1 tablet (100 mg total) by mouth 2 (two) times daily.   60 tablet   3    BP 152/90  Pulse 89  Temp(Src) 98.8 F (37.1 C) (Oral)  Resp 18  SpO2 97%  LMP 07/27/2013 Physical Exam  Nursing note and vitals reviewed. Constitutional: She appears well-developed and well-nourished. No distress.  Pt lying comfortably in exam bed, NAD.   HENT:  Head: Normocephalic and atraumatic.  Right Ear: Hearing, tympanic membrane, external ear and ear canal normal.  Left Ear: Hearing, tympanic membrane, external ear and ear canal normal.  Nose: Mucosal edema and rhinorrhea present.  Mouth/Throat: Uvula is midline, oropharynx is clear and moist and mucous membranes are normal.  Eyes: Conjunctivae are normal. No scleral icterus.  Neck: Normal range of motion. Neck supple.  Cardiovascular: Normal rate, regular rhythm and normal heart sounds.   Pulmonary/Chest: Effort normal and breath sounds normal. No stridor. No respiratory distress. She has no wheezes. She has no rales. She exhibits no tenderness.  No respiratory distress, able to speak in full sentences w/o difficulty. Lungs: CTAB  Abdominal: Soft. Bowel sounds are normal. She exhibits no distension and no mass. There is no tenderness. There is no rebound and no guarding.  Soft, non-tender  Musculoskeletal: Normal range of motion.  Lymphadenopathy:    She has no cervical adenopathy.  Neurological: She is alert.  Skin: Skin is warm and dry. She is not diaphoretic.    ED Course  Procedures (including critical care time) Labs Review Labs Reviewed - No data to display Imaging Review No results found.  EKG Interpretation   None       MDM   1. Flu-like symptoms    Pt with flu-like symptoms appears well, non-toxic. Lungs: CTAB, no respiratory distress. Abd: soft, non-tender.   Rx:  tamiflu. Advised to use acetaminophen and ibuprofen as needed for fever and pain. Encouraged rest and fluids. Return precautions provided. Pt verbalized understanding and agreement with tx plan.   Noland Fordyce, PA-C 09/05/13 [phone]

## 2013-09-08 NOTE — ED Provider Notes (Signed)
Medical screening examination/treatment/procedure(s) were performed by non-physician practitioner and as supervising physician I was immediately available for consultation/collaboration.  EKG Interpretation   None         Maudry Diego, MD 09/08/13 [phone]

## 2013-11-09 ENCOUNTER — Emergency Department (HOSPITAL_COMMUNITY): Payer: Medicaid Other

## 2013-11-09 ENCOUNTER — Encounter (HOSPITAL_COMMUNITY): Payer: Self-pay | Admitting: Emergency Medicine

## 2013-11-09 ENCOUNTER — Emergency Department (HOSPITAL_COMMUNITY)
Admission: EM | Admit: 2013-11-09 | Discharge: 2013-11-09 | Disposition: A | Discharge status: Home / Self Care | Payer: Medicaid Other | Attending: Emergency Medicine | Admitting: Emergency Medicine

## 2013-11-09 DIAGNOSIS — K219 Gastro-esophageal reflux disease without esophagitis: Secondary | ICD-10-CM | POA: Insufficient documentation

## 2013-11-09 DIAGNOSIS — Z860100 Personal history of colon polyps, unspecified: Secondary | ICD-10-CM | POA: Insufficient documentation

## 2013-11-09 DIAGNOSIS — K222 Esophageal obstruction: Secondary | ICD-10-CM | POA: Insufficient documentation

## 2013-11-09 DIAGNOSIS — B9789 Other viral agents as the cause of diseases classified elsewhere: Secondary | ICD-10-CM | POA: Insufficient documentation

## 2013-11-09 DIAGNOSIS — Z87440 Personal history of urinary (tract) infections: Secondary | ICD-10-CM | POA: Insufficient documentation

## 2013-11-09 DIAGNOSIS — Z79899 Other long term (current) drug therapy: Secondary | ICD-10-CM | POA: Insufficient documentation

## 2013-11-09 DIAGNOSIS — F329 Major depressive disorder, single episode, unspecified: Secondary | ICD-10-CM | POA: Insufficient documentation

## 2013-11-09 DIAGNOSIS — F17200 Nicotine dependence, unspecified, uncomplicated: Secondary | ICD-10-CM | POA: Insufficient documentation

## 2013-11-09 DIAGNOSIS — G43909 Migraine, unspecified, not intractable, without status migrainosus: Secondary | ICD-10-CM | POA: Insufficient documentation

## 2013-11-09 DIAGNOSIS — F411 Generalized anxiety disorder: Secondary | ICD-10-CM | POA: Insufficient documentation

## 2013-11-09 DIAGNOSIS — Z9089 Acquired absence of other organs: Secondary | ICD-10-CM | POA: Insufficient documentation

## 2013-11-09 DIAGNOSIS — I10 Essential (primary) hypertension: Secondary | ICD-10-CM | POA: Insufficient documentation

## 2013-11-09 DIAGNOSIS — B349 Viral infection, unspecified: Secondary | ICD-10-CM

## 2013-11-09 DIAGNOSIS — Z8742 Personal history of other diseases of the female genital tract: Secondary | ICD-10-CM | POA: Insufficient documentation

## 2013-11-09 DIAGNOSIS — F3289 Other specified depressive episodes: Secondary | ICD-10-CM | POA: Insufficient documentation

## 2013-11-09 DIAGNOSIS — Z9889 Other specified postprocedural states: Secondary | ICD-10-CM | POA: Insufficient documentation

## 2013-11-09 LAB — I-STAT TROPONIN, ED
Troponin i, poc: 0.01 ng/mL (ref 0.00–0.08)
Troponin i, poc: 0.01 ng/mL (ref 0.00–0.08)

## 2013-11-09 LAB — COMPREHENSIVE METABOLIC PANEL
ALT: 17 U/L (ref 0–35)
AST: 19 U/L (ref 0–37)
Albumin: 3.6 g/dL (ref 3.5–5.2)
Alkaline Phosphatase: 65 U/L (ref 39–117)
BUN: 8 mg/dL (ref 6–23)
CO2: 28 mEq/L (ref 19–32)
Calcium: 9.2 mg/dL (ref 8.4–10.5)
Chloride: 101 mEq/L (ref 96–112)
Creatinine, Ser: 0.83 mg/dL (ref 0.50–1.10)
GFR calc Af Amer: >90 mL/min (ref >90)
GFR calc non Af Amer: 83 mL/min — ABNORMAL LOW (ref >90)
Glucose, Bld: 89 mg/dL (ref 70–99)
Potassium: 4.1 mEq/L (ref 3.7–5.3)
Sodium: 139 mEq/L (ref 137–147)
Total Bilirubin: 0.3 mg/dL (ref 0.3–1.2)
Total Protein: 7.4 g/dL (ref 6.0–8.3)

## 2013-11-09 LAB — LIPASE, BLOOD: Lipase: 31 U/L (ref 11–59)

## 2013-11-09 LAB — CBC
HCT: 39.4 % (ref 36.0–46.0)
Hemoglobin: 12.6 g/dL (ref 12.0–15.0)
MCH: 30 pg (ref 26.0–34.0)
MCHC: 32 g/dL (ref 30.0–36.0)
MCV: 93.8 fL (ref 78.0–100.0)
Platelets: 350 10*3/uL (ref 150–400)
RBC: 4.2 MIL/uL (ref 3.87–5.11)
RDW: 14 % (ref 11.5–15.5)
WBC: 7.2 10*3/uL (ref 4.0–10.5)

## 2013-11-09 MED ORDER — ONDANSETRON HCL 4 MG PO TABS: 4.0000 mg | ORAL_TABLET | Freq: Four times a day (QID) | ORAL | Status: DC

## 2013-11-09 MED ORDER — DIPHENHYDRAMINE HCL 50 MG/ML IJ SOLN
25.0000 mg | Freq: Once | INTRAMUSCULAR | Status: AC
Start: 2013-11-09 — End: 2013-11-09
  Administered 2013-11-09: 25 mg via INTRAVENOUS
  Filled 2013-11-09: qty 1

## 2013-11-09 MED ORDER — SODIUM CHLORIDE 0.9 % IV BOLUS (SEPSIS)
1000.0000 mL | Freq: Once | INTRAVENOUS | Status: AC
  Administered 2013-11-09: 1000 mL via INTRAVENOUS

## 2013-11-09 MED ORDER — METOCLOPRAMIDE HCL 5 MG/ML IJ SOLN
10.0000 mg | Freq: Once | INTRAMUSCULAR | Status: AC
  Administered 2013-11-09: 10 mg via INTRAVENOUS
  Filled 2013-11-09: qty 2

## 2013-11-09 NOTE — ED Notes (Signed)
Pt ambulated to BR with tech, standby assist

## 2013-11-09 NOTE — Discharge Instructions (Signed)
Please call your doctor for a followup appointment within 24-48 hours. When you talk to your doctor please let them know that you were seen in the emergency department and have them acquire all of your records so that they can discuss the findings with you and formulate a treatment plan to fully care for your new and ongoing problems. Please call and set-up an appointment to be seen and re-assessed by your primary doctor Please rest and stay hydrated Please take medications as prescribed - zofran for nausea Please continue to take at home medications as prescribed.  Please avoid foods that are spicy, carbonated, high in fat and grease Please while eating and sleeping - prop self up with pillows Can use Excedrin Migraine relief over the counter - please take on a full stomach  Please continue to monitor symptoms closely and if symptoms are to worsen or change (fever greater than 101, chills, sweating, nausea, vomiting, diarrhea, coughing up of blood, weakness, numbness, tingling, facial drooping, slurred speech, weakness to one side of the body, inability keep food or fluids down, blood in the stools, black for stools difficulty swallowing, chest pain, shortness of breath, difficulty breathing) please report back to emergency department immediately  Migraine Headache A migraine headache is an intense, throbbing pain on one or both sides of your head. A migraine can last for 30 minutes to several hours. CAUSES  The exact cause of a migraine headache is not always known. However, a migraine may be caused when nerves in the brain become irritated and release chemicals that cause inflammation. This causes pain. Certain things may also trigger migraines, such as:  Alcohol.  Smoking.  Stress.  Menstruation.  Aged cheeses.  Foods or drinks that contain nitrates, glutamate, aspartame, or tyramine.  Lack of sleep.  Chocolate.  Caffeine.  Hunger.  Physical exertion.  Fatigue.  Medicines  used to treat chest pain (nitroglycerine), birth control pills, estrogen, and some blood pressure medicines. SIGNS AND SYMPTOMS  Pain on one or both sides of your head.  Pulsating or throbbing pain.  Severe pain that prevents daily activities.  Pain that is aggravated by any physical activity.  Nausea, vomiting, or both.  Dizziness.  Pain with exposure to bright lights, loud noises, or activity.  General sensitivity to bright lights, loud noises, or smells. Before you get a migraine, you may get warning signs that a migraine is coming (aura). An aura may include:  Seeing flashing lights.  Seeing bright spots, halos, or zig-zag lines.  Having tunnel vision or blurred vision.  Having feelings of numbness or tingling.  Having trouble talking.  Having muscle weakness. DIAGNOSIS  A migraine headache is often diagnosed based on:  Symptoms.  Physical exam.  A CT scan or MRI of your head. These imaging tests cannot diagnose migraines, but they can help rule out other causes of headaches. TREATMENT Medicines may be given for pain and nausea. Medicines can also be given to help prevent recurrent migraines.  HOME CARE INSTRUCTIONS  Only take over-the-counter or prescription medicines for pain or discomfort as directed by your health care provider. The use of long-term narcotics is not recommended.  Lie down in a dark, quiet room when you have a migraine.  Keep a journal to find out what may trigger your migraine headaches. For example, write down:  What you eat and drink.  How much sleep you get.  Any change to your diet or medicines.  Limit alcohol consumption.  Quit smoking if you smoke.  Get 7 9 hours of sleep, or as recommended by your health care provider.  Limit stress.  Keep lights dim if bright lights bother you and make your migraines worse. SEEK IMMEDIATE MEDICAL CARE IF:   Your migraine becomes severe.  You have a fever.  You have a stiff  neck.  You have vision loss.  You have muscular weakness or loss of muscle control.  You start losing your balance or have trouble walking.  You feel faint or pass out.  You have severe symptoms that are different from your first symptoms. MAKE SURE YOU:   Understand these instructions.  Will watch your condition.  Will get help right away if you are not doing well or get worse. Document Released: 08/21/2005 Document Revised: 06/11/2013 Document Reviewed: 04/28/2013 [facility] Patient Information 2014 [place].   Emergency Department Resource Guide 1) Find a Doctor and Pay Out of Pocket Although you won't have to find out who is covered by your insurance plan, it is a good idea to ask around and get recommendations. You will then need to call the office and see if the doctor you have chosen will accept you as a new patient and what types of options they offer for patients who are self-pay. Some doctors offer discounts or will set up payment plans for their patients who do not have insurance, but you will need to ask so you aren't surprised when you get to your appointment.  2) Contact Your Local Health Department Not all health departments have doctors that can see patients for sick visits, but many do, so it is worth a call to see if yours does. If you don't know where your local health department is, you can check in your phone book. The CDC also has a tool to help you locate your state's health department, and many state websites also have listings of all of their local health departments.  3) Find a [facility] If your illness is not likely to be very severe or complicated, you may want to try a walk in clinic. These are popping up all over the country in pharmacies, drugstores, and shopping centers. They're usually staffed by nurse practitioners or physician assistants that have been trained to treat common illnesses and complaints. They're usually fairly quick and  inexpensive. However, if you have serious medical issues or chronic medical problems, these are probably not your best option.  No Primary Care Doctor: - Call Health Connect at  [phone] - they can help you locate a primary care doctor that  accepts your insurance, provides certain services, etc. - Physician Referral Service- [phone]  Chronic Pain Problems: Organization         Address  Phone   Notes  [facility]  [phone] Patients need to be referred by their primary care doctor.   Medication Assistance: Organization         Address  Phone   Notes  [facility] Medication [address] [facility] [place], [place] [address] [phone] --Must be a resident of [facility] -- Must have NO insurance coverage whatsoever (no Medicaid/ Medicare, etc.) -- The pt. MUST have a primary care doctor that directs their care regularly and follows them in the community   [facility]ist  [phone]   [employer]  [phone]    Agencies that provide inexpensive medical care: Organization         Address  Phone   Notes  [facility]  [phone]   [facility]    [phone]   [facility] [place], [place] [address] [phone]   [place] [address] [place] [phone]   Planned Parenthood    [phone]   [facility]    [phone]   [place] and [place] [address], [place] Phone:  [phone], Fax:  [phone] Hours of Operation:  9 am - 6 pm, M-F.  Also accepts Medicaid/Medicare and self-pay.  [facility] for [address], [address], [place] Phone: [phone], Fax: [phone]. Hours of Operation:  8:30 am - 5:30 pm, M-F.  Also accepts Medicaid and self-pay.  [facility] [place] [address] [place] Phone: [phone]   [place], [place], [place] [phone], Ext. [phone] Mondays & Thursdays: 7-9 AM.  First 15 patients are seen on a first come, first serve basis.    [place] Providers:  Organization         Address  Phone   Notes  [facility] [address], [place] [phone] Also accepts self-pay patients.  [facility] [id number] [place], [place]  [phone]   [place], [address], [place] [phone]   [facility] [address], [place] [phone]   Lucianne Lei [address], [place]   [phone] Only accepts [place] Access [place] patients after they have their name applied to their card.   Self-Pay (no insurance) in [facility]:  Organization         Address  Phone   Notes  Sickle Cell Patients, [facility] [place] [phone]   [facility] [place] [phone]   [facility] [place]  [place], [place], [place] [phone]   [facility]/Dr. Osei-Bonsu  [address] [place] or [address], [place] [phone] Phone number for both [place] and [place] locations is the same.  [facility] [address], [place] [phone]   [facility] [address] [place] or [address] [phone]   [facility] [address] [place] [phone], phone; [phone], fax Sees patients 1st and 3rd Saturday of every month.  Must not qualify for public or private insurance (i.e. Medicaid, Medicare, [place] Health Choice, Veterans' Benefits)  Household income should be no more than 200% of the poverty level The clinic cannot treat you if you are pregnant or  think you are pregnant  Sexually transmitted diseases are not treated at the clinic.    Dental Care: Organization         Address  Phone  Notes  [facility] Department of [facility] [place] [phone] Accepts children up to age 75 who are enrolled in [place] or [place]; pregnant women with a Medicaid card; and children who have applied for Medicaid or  Health Choice, but were declined, whose parents can pay a reduced fee at time of service.  [facility] Department of  [facility]  [address], [place] [phone] Accepts children up to age 26 who are enrolled in Medicaid or [place]; pregnant women with a Medicaid card; and children who have applied for Medicaid or [place] Health Choice, but were declined, whose parents can pay a reduced fee at time of service.  [place] Adult Dental Access PROGRAM  [place] [phone] Patients are seen by appointment only. Walk-ins are not accepted. [place] will see patients 11 years of age and older. Monday - Tuesday (8am-5pm) Most Wednesdays (8:30-5pm) $30 per visit, cash only  [facility] Adult Dental Access PROGRAM  [address], [facility] [phone] Patients are seen by appointment only. Walk-ins are not accepted. [place] will see patients 27 years of age and older. One Wednesday Evening (Monthly: Volunteer Based).  $30 per visit, cash only  [place]  [phone] for adults; Children under age 20, call Graduate Pediatric Dentistry at [phone]. Children aged 21-14, please call [phone] to request a pediatric application.  Dental services are provided in all areas of dental care including fillings, crowns and bridges, complete and partial dentures, implants, gum treatment, root canals, and extractions. Preventive care is also provided. Treatment is provided to both adults  and children. Patients are selected via a lottery and there is often a waiting list.   [facility] [address] [place]  [phone] [email or web address]   [facility] [address] [place], [place] [phone], Ext. [phone] Second and Fourth Thursday of each month, opens at 6:30 AM; Clinic ends at 9 AM.  Patients are seen on a first-come first-served basis, and a limited number are seen during each clinic.   [facility]  [address] [place], [place] [phone]   Eligibility Requirements You must have lived in [place], [place], or [place] counties for at least the last three months.   You cannot be eligible for state or federal sponsored [employer], including [employer], [place], or [employer].   You generally cannot be eligible for healthcare insurance through your employer.    How to apply: Eligibility screenings are held every Tuesday and Wednesday afternoon from 1:00 pm until 4:00 pm. You do not need an appointment for the interview!  [facility] [address], [place], [place]   [place]  [place] Department  [place]  [phone]    Behavioral Health Resources in the Community: Intensive Outpatient Programs Organization         Address  Phone  Notes  [place]. [address], [place], [place] [phone]   [facility] Outpatient [address], [place], [place]   ADS: Alcohol & Drug Svcs [address] [place], [place]   [place] [address],  [place], [place] or [phone]   Substance Abuse Resources Organization         Address  Phone  Notes  Alcohol and Drug Services  [phone]   [place]  [phone]   [place]    Chinita Pester  [phone]   Residential & Outpatient Substance Abuse Program  [phone]   Psychological Services Organization         Address  Phone  Notes  [place]  [place]   [place] [address] [place] or [phone]    Mobile Crisis Teams Organization         Address  Phone  Notes  [facility], Mobile Crisis Care Unit  [phone]   [facility]  [address] [place], [place]   [place] [address] [place] [phone]    Self-Help/Support Groups Organization         Address  Phone             Notes  [place]. of [place] - variety of support groups  [place] Call for more information  Narcotics Anonymous (NA), [facility] [address], [employer] [place]  2 meetings at this location   Special educational needs teacher         Address  Phone  Notes  [facility]idential [facility] [place],    [place]  [phone]   [facility]  [address], [place] [id number], [place], [place]   [place], [place] [phone] Admissions: 8am-3pm M-F  Incentives Substance [place] [address]    [place], [place] [phone]   [facility] [address] [place], [place], [place]   [facility] [address]  [place], [place]   [facility] - Intensive Outpatient [address], [place], [place]   [facility] ([place].) [place].,  [place], [place] [phone] or [phone]   [facility] (RTS) [address], [place], [place] Accepts Medicaid  [facility] [place] [address],  [place] [phone] Substance Abuse/Addiction Treatment   [facility] Organization         Address  Phone  Notes  [facility]  [phone]   Domenic Schwab, PhD [address] [place], [place]   [phone] or [phone]   [place], [place] [phone]   [facility] [address] [place], [place] [phone] Insurance/Medicaid/sponsorship through [facility] and Families [address], [place]                                    [place], [place] [phone] [place] [address], [place] [phone]    Dr. Adele Schilder  [phone]   [facility] Dept. 1) [address] [place] 2) [place] 3)  [place] [address], [place] [phone]  [phone]   [place] [phone] or [phone] (After Hours)

## 2013-11-09 NOTE — ED Provider Notes (Signed)
CSN: [id number]     Arrival date & time 11/09/13  0840 History   First MD Initiated Contact with Patient 11/09/13 [phone]     Chief Complaint  Patient presents with  . Migraine  . Emesis     (Consider location/radiation/quality/duration/timing/severity/associated sxs/prior Treatment) Patient is a 48 y.o. female presenting with migraines and vomiting. The history is provided by the patient. No language interpreter was used.  Migraine Associated symptoms include congestion, coughing, headaches, nausea, a sore throat and vomiting. Pertinent negatives include no abdominal pain, chest pain, chills, fever, neck pain, numbness or weakness.  Emesis Associated symptoms: headaches and sore throat   Associated symptoms: no abdominal pain, no chills and no diarrhea   Bailey Russell is a 48 y/o F with PMHx of HTN, headaches, anxiety, depression, UTI, hiatal hernia presenting to the ED with migraine, cough, sore throat. Patient reported that the migraine started on Thursday - stated that the discomfort started off as a headache and has progressively gotten worse over the past couple of days. Patient reported that the headache is localized to the frontal aspect described as a constant throbbing sensation - stated that she has been using Tylenol with minimal relief. Stated that she has been also experiencing productive cough of greenish sputum - reported soreness and chest tightness when she coughs, only. Stated that she has been having nasal congestion. Patient reported that she has esophageal narrowing - stated that she was used to be seen by GI, but stated that she has not seen GI in a long time to get esophageal dilation. Patient reported that she has been having mild abdominal cramping associated with episodes of emesis. Patient reported that within the past 24 hours she has had one episodes of emesis. Denied fever, chills, shortness of breath, difficulty breathing, difficulty swallowing, diarrhea, urinary  symptoms, melena or complicated, numbness, tingling, slurred speech, visual distortions, thunderclap onset, worst headache of life. PCP Dr. Doreene Burke  Past Medical History  Diagnosis Date  . Hypertension   . Headache(784.0)     migraines  . Anxiety   . Depression   . Urinary tract infection   . Ectopic pregnancy   . Inflammatory polyps of colon   . Acid reflux   . Esophageal stricture   . Hiatal hernia    Past Surgical History  Procedure Laterality Date  . Ectopic pregnancy surgery    . Tonsillectomy    . Cholecystectomy    . Wisdom tooth extraction    . Ovarian cyst removal    . Mass removed from r buttock    . Laparoscopy      X 2 to remove scar tissue  . Appendectomy    . Polypectomy      polyps removed from colon during colonoscopy   Family History  Problem Relation Age of Onset  . Anesthesia problems Neg Hx   . Diabetes Neg Hx   . Heart attack Neg Hx   . Hyperlipidemia Neg Hx   . Sudden death Neg Hx   . Hypertension Mother   . Hypertension Sister   . Hypertension Brother    History  Substance Use Topics  . Smoking status: Current Every Day Smoker -- 0.50 packs/day for 1 years    Types: Cigarettes  . Smokeless tobacco: Never Used  . Alcohol Use: Yes     Comment: special events, wine or beer   OB History   Grav Para Term Preterm Abortions TAB SAB Ect Mult Living   6 4 4  0  2 0 1 1 0 4     Review of Systems  Constitutional: Negative for fever and chills.  HENT: Positive for congestion and sore throat. Negative for trouble swallowing.   Respiratory: Positive for cough and chest tightness. Negative for shortness of breath.   Cardiovascular: Negative for chest pain.  Gastrointestinal: Positive for nausea and vomiting. Negative for abdominal pain, diarrhea and constipation.  Musculoskeletal: Negative for back pain and neck pain.  Neurological: Positive for headaches. Negative for dizziness, weakness and numbness.  All other systems reviewed and are  negative.      Allergies  Review of patient's allergies indicates no known allergies.  Home Medications   Current Outpatient Rx  Name  Route  Sig  Dispense  Refill  . acetaminophen (TYLENOL) 500 MG tablet   Oral   Take 1,000 mg by mouth every 6 (six) hours as needed.         Marland Kitchen amLODipine (NORVASC) 5 MG tablet   Oral   Take 1 tablet (5 mg total) by mouth daily.   90 tablet   3   . citalopram (CELEXA) 20 MG tablet   Oral   Take 1 tablet (20 mg total) by mouth daily.   30 tablet   3   . clonazePAM (KLONOPIN) 0.5 MG tablet   Oral   Take 1 tablet (0.5 mg total) by mouth 2 (two) times daily as needed for anxiety.   60 tablet   1   . diphenhydramine-acetaminophen (TYLENOL PM) 25-500 MG TABS   Oral   Take 3-5 tablets by mouth as needed (for migraine).          Marland Kitchen DM-Doxylamine-Acetaminophen (VICKS NYQUIL COLD & FLU) 15-6.25-325 MG/15ML LIQD   Oral   Take 30 mLs by mouth at bedtime as needed (cough).         . hydrochlorothiazide (HYDRODIURIL) 25 MG tablet   Oral   Take 1 tablet (25 mg total) by mouth daily.   90 tablet   3   . ondansetron (ZOFRAN) 4 MG tablet   Oral   Take 4 mg by mouth every 8 (eight) hours as needed for nausea or vomiting.         . ranitidine (ZANTAC) 150 MG tablet   Oral   Take 150 mg by mouth 2 (two) times daily as needed for heartburn.         . topiramate (TOPAMAX) 100 MG tablet   Oral   Take 100 mg by mouth 2 (two) times daily as needed. Migraines         . ondansetron (ZOFRAN) 4 MG tablet   Oral   Take 1 tablet (4 mg total) by mouth every 6 (six) hours.   12 tablet   0    BP 133/72  Pulse 72  Temp(Src) 98.9 F (37.2 C) (Oral)  Resp 18  SpO2 97%  LMP 10/21/2013 Physical Exam  Nursing note and vitals reviewed. Constitutional: She is oriented to person, place, and time. She appears well-developed and well-nourished. No distress.  HENT:  Head: Normocephalic and atraumatic.  Mouth/Throat: Oropharynx is clear and  moist. No oropharyngeal exudate.  Eyes: Conjunctivae and EOM are normal. Pupils are equal, round, and reactive to light. Right eye exhibits no discharge. Left eye exhibits no discharge.  Negative nystagmus Visual fields grossly intact  Neck: Normal range of motion. Neck supple. No tracheal deviation present.  Negative neck stiffness Negative nuchal rigidity Negative cervical lymphadenopathy Negative meningeal signs  Cardiovascular: Normal rate,  regular rhythm and normal heart sounds.  Exam reveals no friction rub.   No murmur heard. Pulses:      Radial pulses are 2+ on the right side, and 2+ on the left side.       Dorsalis pedis pulses are 2+ on the right side, and 2+ on the left side.  Pulmonary/Chest: Effort normal and breath sounds normal. No respiratory distress. She has no wheezes. She has no rales. She exhibits tenderness.    Negative use of accessory muscles Patient stable to speak in full sentences without difficulty Discomfort upon palpation to the chest wall-discomfort is reproducible upon palpation  Abdominal: Soft. Bowel sounds are normal. There is no tenderness. There is no guarding.  Obese  Musculoskeletal: Normal range of motion.  Full ROM to upper and lower extremities without difficulty noted, negative ataxia noted.  Lymphadenopathy:    She has no cervical adenopathy.  Neurological: She is alert and oriented to person, place, and time. No cranial nerve deficit. She exhibits normal muscle tone. Coordination normal.  Cranial nerves III-XII grossly intact Strength 5+/5+ to upper and lower extremities bilaterally with resistance applied, equal distribution noted Equal strength Sensation intact  Negative arm drift Patient able to bring finger to nose bilaterally without difficulty or ataxia noted Heel to knee down shin normal bilaterally Gait proper, proper balance - negative sway, negative drift, negative step-offs Negative facial drooping, negative slurred speech    Skin: Skin is warm and dry. No rash noted. She is not diaphoretic. No erythema.  Psychiatric: She has a normal mood and affect. Her behavior is normal. Thought content normal.    ED Course  Procedures (including critical care time)  1:31 PM This provider reassessed the patient. Patient reported that her headache is doing much better, reported that discomfort to be a 5/10. Patient able to tolerate fluids PO without difficulty. Patient requesting food. Discussed with patient labs and imaging in great detail.   Results for orders placed during the hospital encounter of 11/09/13  CBC      Result Value Ref Range   WBC 7.2  4.0 - 10.5 K/uL   RBC 4.20  3.87 - 5.11 MIL/uL   Hemoglobin 12.6  12.0 - 15.0 g/dL   HCT 39.4  36.0 - 46.0 %   MCV 93.8  78.0 - 100.0 fL   MCH 30.0  26.0 - 34.0 pg   MCHC 32.0  30.0 - 36.0 g/dL   RDW 14.0  11.5 - 15.5 %   Platelets 350  150 - 400 K/uL  COMPREHENSIVE METABOLIC PANEL      Result Value Ref Range   Sodium 139  137 - 147 mEq/L   Potassium 4.1  3.7 - 5.3 mEq/L   Chloride 101  96 - 112 mEq/L   CO2 28  19 - 32 mEq/L   Glucose, Bld 89  70 - 99 mg/dL   BUN 8  6 - 23 mg/dL   Creatinine, Ser 0.83  0.50 - 1.10 mg/dL   Calcium 9.2  8.4 - 10.5 mg/dL   Total Protein 7.4  6.0 - 8.3 g/dL   Albumin 3.6  3.5 - 5.2 g/dL   AST 19  0 - 37 U/L   ALT 17  0 - 35 U/L   Alkaline Phosphatase 65  39 - 117 U/L   Total Bilirubin 0.3  0.3 - 1.2 mg/dL   GFR calc non Af Amer 83 (*) >90 mL/min   GFR calc Af Amer >90  >90  mL/min  LIPASE, BLOOD      Result Value Ref Range   Lipase 31  11 - 59 U/L  I-STAT TROPOININ, ED      Result Value Ref Range   Troponin i, poc 0.01  0.00 - 0.08 ng/mL   Comment 3           I-STAT TROPOININ, ED      Result Value Ref Range   Troponin i, poc 0.01  0.00 - 0.08 ng/mL   Comment 3            Dg Chest 2 View  11/09/2013   CLINICAL DATA:  Cough for the past 3 days.  Chest pain.  EXAM: CHEST  2 VIEW  COMPARISON:  Chest x-ray 06/16/2013.   FINDINGS: Lung volumes are low. No consolidative airspace disease. No pleural effusions. No evidence of pulmonary edema. Heart size is mildly enlarged (unchanged). Upper mediastinal contours are unremarkable.  IMPRESSION: 1. No radiographic evidence of acute cardiopulmonary disease. 2. Mild cardiomegaly.   Electronically Signed   By: Vinnie Langton M.D.   On: 11/09/2013 10:48   Labs Review Labs Reviewed  COMPREHENSIVE METABOLIC PANEL - Abnormal; Notable for the following:    GFR calc non Af Amer 83 (*)    All other components within normal limits  CBC  LIPASE, BLOOD  I-STAT TROPOININ, ED  I-STAT TROPOININ, ED   Imaging Review Dg Chest 2 View  11/09/2013   CLINICAL DATA:  Cough for the past 3 days.  Chest pain.  EXAM: CHEST  2 VIEW  COMPARISON:  Chest x-ray 06/16/2013.  FINDINGS: Lung volumes are low. No consolidative airspace disease. No pleural effusions. No evidence of pulmonary edema. Heart size is mildly enlarged (unchanged). Upper mediastinal contours are unremarkable.  IMPRESSION: 1. No radiographic evidence of acute cardiopulmonary disease. 2. Mild cardiomegaly.   Electronically Signed   By: Vinnie Langton M.D.   On: 11/09/2013 10:48     EKG Interpretation   Date/Time:  Sunday November 09 2013 10:11:05 EDT Ventricular Rate:  67 PR Interval:  186 QRS Duration: 95 QT Interval:  418 QTC Calculation: 441 R Axis:   -4 Text Interpretation:  Sinus rhythm No significant change since last  tracing Confirmed by Ashok Cordia  MD, Lennette Bihari ([id number]) on 11/09/2013 10:33:54 AM      MDM   Final diagnoses:  Migraine  Viral syndrome  Esophageal stricture   Medications  metoCLOPramide (REGLAN) injection 10 mg (10 mg Intravenous Given 11/09/13 0959)  diphenhydrAMINE (BENADRYL) injection 25 mg (25 mg Intravenous Given 11/09/13 1000)  sodium chloride 0.9 % bolus 1,000 mL (0 mLs Intravenous Stopped 11/09/13 1428)   Filed Vitals:   11/09/13 0900 11/09/13 0919 11/09/13 1147 11/09/13 1430  BP: 143/79   130/65 133/72  Pulse: 77  74 72  Temp: 98.9 F (37.2 C)     TempSrc: Oral     Resp: 20  16 18   SpO2: 99% 98% 98% 97%    Patient presenting to the ED with migraine that started on Thursday to start off as a headache is gone progressively worse gradually. Described the discomfort as a constant throbbing sensation to the frontal aspect with associated photophobia, phonophobia, nausea and emesis. Stated that she's been experiencing cough of a greenish sputum production with associated sore throat that worsens with cough as well as chest discomfort associated only with coughing spells. Stated that she's been using Tylenol with minimal relief. Reported abdominal cramping associated with episodes of emesis. Alert and oriented. GCS  15. Heart rate and rhythm normal. Lungs clear to auscultation to upper and lower lobes bilaterally. Radial and DP pulses 2+ bilaterally. Obese. Bowel sounds normal active in all 4 quadrants. Negative pain upon palpation-negative acute abdomen, negative peritoneal signs-nonsurgical abdomen noted. Full range of motion to upper and lower extremities bilaterally. Negative nystagmus, visual fields grossly intact. Negative facial drooping. Negative slurred speech. Strength intact with equal distribution to equal grip strength. Sensation intact. Patient is able to bring finger to nose without difficulty. Patient stable to bring heel to knee down shin without difficulty bilaterally. Negative focal neurological deficits identified. EKG noted normal sinus rhythm with a heart rate 67 beats per minute-negative ischemic findings or changes noted to previous EKG. First i-STAT troponin negative elevation. Second i-STAT troponin negative elevation. CBC negative elevation of white blood cell count identified. CMP negative findings. Lipase negative elevation. Chest x-ray negative for acute cardiopulmonary disease identified, mild cardiomegaly noted. Doubt SAH. Doubt ICH. Doubt stroke. Benign abdominal  exam. Doubt appendicitis, doubt pancreatitis. Negative neurological deficits noted. Doubt pneumonia. Doubt pneumothorax. Suspicion to be migraines secondary to same symptoms patient that in the past when having migraines. Suspicion to be viral infection, upper respiratory. Suspicion to be grossly symptoms secondary to esophageal stricture-patient has history of esophageal strictures has not been followed up with GI regarding endoscopy and dilation in the past 2 years. Suspicion to be chest discomfort secondary to cough-discomfort reproducible upon palpation to the chest wall. Pain medications administered in ED setting relief the pain. Patient able to tolerate food and fluid without difficulty swallowing or episodes of emesis. Patient not septic appearing. Discharged patient. Referred patient to primary care provider GI. Discussed with patient proper diet. Discharge patient with Zofran. Discussed with patient to continue to take home medications as prescribed. Discussed with patient to closely monitor symptoms and if symptoms are to worsen or change to report back to the ED - strict return instructions given.  Patient agreed to plan of care, understood, all questions answered.   Jamse Mead, PA-C 11/10/13 1219

## 2013-11-09 NOTE — ED Notes (Signed)
Pt reports onset of symptoms on Thursday w/migraine. Reports N/V onset Friday am. States cough and HA may be due to "tickling" from reflux. Pt. States HA and abd pain 10/10. Reports abdominal "cramping." Pt. Resting in NAD.

## 2013-11-09 NOTE — ED Notes (Signed)
She c/o migraine which is typical in its location (frontal), but "this is lasting longer than usual and I'm vomiting--I hadn't had one (migraine) in a long time."  She is oriented x 4 with clear speech and is in no distress.  She is mildly photophobic.

## 2013-11-11 NOTE — ED Provider Notes (Signed)
Medical screening examination/treatment/procedure(s) were conducted as a shared visit with non-physician practitioner(s) and myself.  I personally evaluated the patient during the encounter.   EKG Interpretation   Date/Time:  Sunday November 09 2013 10:11:05 EDT Ventricular Rate:  67 PR Interval:  186 QRS Duration: 95 QT Interval:  418 QTC Calculation: 441 R Axis:   -4 Text Interpretation:  Sinus rhythm No significant change since last  tracing Confirmed by Stetson Pelaez  MD, Lennette Bihari ([id number]) on 11/09/2013 10:33:54 AM      Pt states hx migraines and having same type of headache. Dull. Gradual onset. No fever. No neck stiffness or rigidity on exam. No sinus or temporal tenderness. Will provide rx for headache.   Mirna Mires, MD 11/11/13 [phone]

## 2013-12-31 ENCOUNTER — Ambulatory Visit: Payer: Medicaid Other | Admitting: Rehabilitation

## 2014-01-07 ENCOUNTER — Ambulatory Visit: Payer: Medicaid Other | Attending: Orthopedic Surgery | Admitting: Rehabilitation

## 2014-01-07 DIAGNOSIS — M25569 Pain in unspecified knee: Secondary | ICD-10-CM | POA: Insufficient documentation

## 2014-01-07 DIAGNOSIS — M25669 Stiffness of unspecified knee, not elsewhere classified: Secondary | ICD-10-CM | POA: Insufficient documentation

## 2014-01-07 DIAGNOSIS — IMO0001 Reserved for inherently not codable concepts without codable children: Secondary | ICD-10-CM | POA: Insufficient documentation

## 2014-01-09 ENCOUNTER — Ambulatory Visit: Payer: Medicaid Other | Admitting: Family Medicine

## 2014-01-13 ENCOUNTER — Encounter: Payer: Medicaid Other | Admitting: Rehabilitation

## 2014-01-16 ENCOUNTER — Ambulatory Visit: Payer: Medicaid Other | Admitting: Rehabilitation

## 2014-01-16 DIAGNOSIS — IMO0001 Reserved for inherently not codable concepts without codable children: Secondary | ICD-10-CM | POA: Diagnosis not present

## 2014-01-20 ENCOUNTER — Encounter: Payer: Medicaid Other | Admitting: Rehabilitation

## 2014-01-23 ENCOUNTER — Encounter: Payer: Medicaid Other | Admitting: Rehabilitation

## 2014-01-27 ENCOUNTER — Ambulatory Visit: Payer: Medicaid Other | Admitting: Rehabilitation

## 2014-01-30 ENCOUNTER — Ambulatory Visit: Payer: Medicaid Other | Admitting: Rehabilitation

## 2014-02-03 ENCOUNTER — Ambulatory Visit: Payer: Medicaid Other | Attending: Orthopedic Surgery | Admitting: Rehabilitation

## 2014-02-03 DIAGNOSIS — IMO0001 Reserved for inherently not codable concepts without codable children: Secondary | ICD-10-CM | POA: Diagnosis not present

## 2014-02-06 ENCOUNTER — Ambulatory Visit: Payer: Medicaid Other | Admitting: Rehabilitation

## 2014-02-16 ENCOUNTER — Ambulatory Visit: Payer: Medicaid Other | Admitting: Rehabilitation

## 2014-02-18 ENCOUNTER — Ambulatory Visit: Payer: Medicaid Other | Admitting: Rehabilitation

## 2014-03-12 ENCOUNTER — Other Ambulatory Visit (HOSPITAL_COMMUNITY): Payer: Self-pay | Admitting: Cardiology

## 2014-03-12 ENCOUNTER — Ambulatory Visit (HOSPITAL_COMMUNITY): Payer: Medicaid Other | Attending: Cardiology | Admitting: Cardiology

## 2014-03-12 DIAGNOSIS — M79609 Pain in unspecified limb: Secondary | ICD-10-CM | POA: Diagnosis not present

## 2014-03-12 DIAGNOSIS — F17200 Nicotine dependence, unspecified, uncomplicated: Secondary | ICD-10-CM | POA: Diagnosis not present

## 2014-03-12 DIAGNOSIS — M79606 Pain in leg, unspecified: Secondary | ICD-10-CM

## 2014-03-12 DIAGNOSIS — M7989 Other specified soft tissue disorders: Secondary | ICD-10-CM | POA: Diagnosis not present

## 2014-03-12 NOTE — Progress Notes (Signed)
Unilateral lower venous duplex performed  

## 2014-07-06 ENCOUNTER — Encounter (HOSPITAL_COMMUNITY): Payer: Self-pay | Admitting: Emergency Medicine

## 2014-07-07 ENCOUNTER — Emergency Department (HOSPITAL_COMMUNITY)
Admission: EM | Admit: 2014-07-07 | Discharge: 2014-07-07 | Disposition: A | Discharge status: Home / Self Care | Payer: Medicaid Other | Attending: Emergency Medicine | Admitting: Emergency Medicine

## 2014-07-07 ENCOUNTER — Encounter (HOSPITAL_COMMUNITY): Payer: Self-pay | Admitting: *Deleted

## 2014-07-07 DIAGNOSIS — R519 Headache, unspecified: Secondary | ICD-10-CM

## 2014-07-07 DIAGNOSIS — Z79899 Other long term (current) drug therapy: Secondary | ICD-10-CM | POA: Insufficient documentation

## 2014-07-07 DIAGNOSIS — F329 Major depressive disorder, single episode, unspecified: Secondary | ICD-10-CM | POA: Diagnosis not present

## 2014-07-07 DIAGNOSIS — Z720 Tobacco use: Secondary | ICD-10-CM | POA: Insufficient documentation

## 2014-07-07 DIAGNOSIS — I10 Essential (primary) hypertension: Secondary | ICD-10-CM | POA: Insufficient documentation

## 2014-07-07 DIAGNOSIS — Z86010 Personal history of colonic polyps: Secondary | ICD-10-CM | POA: Diagnosis not present

## 2014-07-07 DIAGNOSIS — R110 Nausea: Secondary | ICD-10-CM | POA: Insufficient documentation

## 2014-07-07 DIAGNOSIS — R51 Headache: Secondary | ICD-10-CM | POA: Diagnosis not present

## 2014-07-07 DIAGNOSIS — F419 Anxiety disorder, unspecified: Secondary | ICD-10-CM | POA: Insufficient documentation

## 2014-07-07 DIAGNOSIS — K219 Gastro-esophageal reflux disease without esophagitis: Secondary | ICD-10-CM | POA: Diagnosis not present

## 2014-07-07 DIAGNOSIS — Z87440 Personal history of urinary (tract) infections: Secondary | ICD-10-CM | POA: Insufficient documentation

## 2014-07-07 MED ORDER — SODIUM CHLORIDE 0.9 % IV BOLUS (SEPSIS)
1000.0000 mL | Freq: Once | INTRAVENOUS | Status: AC
  Administered 2014-07-07: 1000 mL via INTRAVENOUS

## 2014-07-07 MED ORDER — KETOROLAC TROMETHAMINE 30 MG/ML IJ SOLN
30.0000 mg | Freq: Once | INTRAMUSCULAR | Status: AC
  Administered 2014-07-07: 30 mg via INTRAVENOUS
  Filled 2014-07-07: qty 1

## 2014-07-07 MED ORDER — DIPHENHYDRAMINE HCL 50 MG/ML IJ SOLN
25.0000 mg | Freq: Once | INTRAMUSCULAR | Status: AC
  Administered 2014-07-07: 25 mg via INTRAVENOUS
  Filled 2014-07-07: qty 1

## 2014-07-07 MED ORDER — PROCHLORPERAZINE EDISYLATE 5 MG/ML IJ SOLN
10.0000 mg | Freq: Once | INTRAMUSCULAR | Status: AC
  Administered 2014-07-07: 10 mg via INTRAVENOUS
  Filled 2014-07-07: qty 2

## 2014-07-07 NOTE — ED Provider Notes (Signed)
CSN: [id number]     Arrival date & time 07/07/14  0803 History   First MD Initiated Contact with Patient 07/07/14 [phone]     Chief Complaint  Patient presents with  . Migraine   (Consider location/radiation/quality/duration/timing/severity/associated sxs/prior Treatment) HPI  Geraldin Habermehl is a 48 yo female presenting with report of headache onset appr 5 hours PTA.  She states she began feeling the headache while sleeping and it eventually woke her up.  She describes it as a tight band and rates it as a 10/10 headache. This is similar in nature to her previous headaches but she states it does hurt more today.  She reports the pain radiates from her left forehead around to the left parietal scalp. She reports photophobia and nausea but no vomiting. It is worse with bending over or with movement.  She denies fever, neck stiffness, blurred vision, or vomiting.  Past Medical History  Diagnosis Date  . Hypertension   . Headache(784.0)     migraines  . Anxiety   . Depression   . Urinary tract infection   . Ectopic pregnancy   . Inflammatory polyps of colon   . Acid reflux   . Esophageal stricture   . Hiatal hernia    Past Surgical History  Procedure Laterality Date  . Ectopic pregnancy surgery    . Tonsillectomy    . Cholecystectomy    . Wisdom tooth extraction    . Ovarian cyst removal    . Mass removed from r buttock    . Laparoscopy      X 2 to remove scar tissue  . Appendectomy    . Polypectomy      polyps removed from colon during colonoscopy   Family History  Problem Relation Age of Onset  . Anesthesia problems Neg Hx   . Diabetes Neg Hx   . Heart attack Neg Hx   . Hyperlipidemia Neg Hx   . Sudden death Neg Hx   . Hypertension Mother   . Hypertension Sister   . Hypertension Brother    History  Substance Use Topics  . Smoking status: Current Every Day Smoker -- 0.50 packs/day for 1 years    Types: Cigarettes  . Smokeless tobacco: Never Used  . Alcohol Use:  Yes     Comment: special events, wine or beer   OB History    Gravida Para Term Preterm AB TAB SAB Ectopic Multiple Living   6 4 4  0 2 0 1 1 0 4     Review of Systems  Constitutional: Negative for fever and chills.  HENT: Negative for sore throat.   Eyes: Negative for visual disturbance.  Respiratory: Negative for cough and shortness of breath.   Cardiovascular: Negative for chest pain and leg swelling.  Gastrointestinal: Positive for nausea. Negative for vomiting and diarrhea.  Genitourinary: Negative for dysuria.  Musculoskeletal: Negative for myalgias and neck stiffness.  Skin: Negative for rash.  Neurological: Positive for headaches. Negative for weakness and numbness.    Allergies  Lisinopril  Home Medications   Prior to Admission medications   Medication Sig Start Date End Date Taking? Authorizing Provider  bisoprolol-hydrochlorothiazide (ZIAC) 5-6.25 MG per tablet Take 1 tablet by mouth daily.   Yes Historical Provider, MD  diphenhydramine-acetaminophen (TYLENOL PM) 25-500 MG TABS Take 3-5 tablets by mouth as needed (for migraine).    Yes Historical Provider, MD  hydrochlorothiazide (HYDRODIURIL) 25 MG tablet Take 1 tablet (25 mg total) by mouth daily. Patient taking differently:  Take 25 mg by mouth daily. Pt usually doesn't take this medication, but took 2 tablets this morning because she couldn't get to other medication. 07/04/13  Yes Shanker Kristeen Mans, MD  meloxicam (MOBIC) 15 MG tablet Take 15 mg by mouth daily.   Yes Historical Provider, MD  acetaminophen (TYLENOL) 500 MG tablet Take 1,000 mg by mouth every 6 (six) hours as needed.    Historical Provider, MD  amLODipine (NORVASC) 5 MG tablet Take 1 tablet (5 mg total) by mouth daily. 07/04/13   Shanker Kristeen Mans, MD  citalopram (CELEXA) 20 MG tablet Take 1 tablet (20 mg total) by mouth daily. 05/26/13   Theodis Blaze, MD  clonazePAM (KLONOPIN) 0.5 MG tablet Take 1 tablet (0.5 mg total) by mouth 2 (two) times daily as  needed for anxiety. 05/26/13   Theodis Blaze, MD  DM-Doxylamine-Acetaminophen (VICKS NYQUIL COLD & FLU) 15-6.25-325 MG/15ML LIQD Take 30 mLs by mouth at bedtime as needed (cough).    Historical Provider, MD  ondansetron (ZOFRAN) 4 MG tablet Take 4 mg by mouth every 8 (eight) hours as needed for nausea or vomiting.    Historical Provider, MD  ondansetron (ZOFRAN) 4 MG tablet Take 1 tablet (4 mg total) by mouth every 6 (six) hours. 11/09/13   Marissa Sciacca, PA-C  ranitidine (ZANTAC) 150 MG tablet Take 150 mg by mouth 2 (two) times daily as needed for heartburn.    Historical Provider, MD  topiramate (TOPAMAX) 100 MG tablet Take 100 mg by mouth 2 (two) times daily as needed. Migraines    Historical Provider, MD   BP 159/94 mmHg  Pulse 92  Temp(Src) 98.4 F (36.9 C)  Resp 20  SpO2 99%  LMP 06/17/2014 Physical Exam  Constitutional: She is oriented to person, place, and time. She appears well-developed and well-nourished. No distress.  HENT:  Head: Normocephalic and atraumatic.    Mouth/Throat: Oropharynx is clear and moist. No oropharyngeal exudate.  Eyes: Conjunctivae are normal.  Neck: Normal range of motion and full passive range of motion without pain. Neck supple. Spinous process tenderness and muscular tenderness present. No rigidity. Normal range of motion present. No thyromegaly present.  Cardiovascular: Normal rate, regular rhythm and intact distal pulses.   Pulmonary/Chest: Effort normal and breath sounds normal. No respiratory distress. She has no wheezes. She has no rales. She exhibits no tenderness.  Abdominal: Soft. There is no tenderness.  Musculoskeletal: She exhibits no tenderness.  Lymphadenopathy:    She has no cervical adenopathy.  Neurological: She is alert and oriented to person, place, and time. She has normal strength. No cranial nerve deficit or sensory deficit. Coordination normal. GCS eye subscore is 4. GCS verbal subscore is 5. GCS motor subscore is 6.  Reflex  Scores:      Patellar reflexes are 2+ on the right side and 2+ on the left side. Cranial nerves 2-12 grossly intact, appropriate 2 pt discrimination.  Skin: Skin is warm and dry. No rash noted. She is not diaphoretic.  Psychiatric: She has a normal mood and affect.  Nursing note and vitals reviewed.   ED Course  Procedures (including critical care time) Labs Review Labs Reviewed - No data to display  Imaging Review No results found.   EKG Interpretation None      MDM   Final diagnoses:  Nonintractable headache, unspecified chronicity pattern, unspecified headache type   48 yo female with headache similar to her previous headaches. Presentation is non concerning for [facility], ICH, Meningitis, or  temporal arteritis. Pt is afebrile with no focal neuro deficits, nuchal rigidity, or change in vision. Pt's HA treated and improved while in ED.  Pt is to follow up with PCP to discuss prophylactic medication. Pt verbalizes understanding and is agreeable with plan to dc. Return precautions provided.   Filed Vitals:   07/07/14 0811 07/07/14 1101  BP: 159/94 140/84  Pulse: 92 84  Temp: 98.4 F (36.9 C) 98.4 F (36.9 C)  TempSrc:  Oral  Resp: 20 18  SpO2: 99% 99%   Meds given in ED:  Medications  sodium chloride 0.9 % bolus 1,000 mL (not administered)  ketorolac (TORADOL) 30 MG/ML injection 30 mg (not administered)  prochlorperazine (COMPAZINE) injection 10 mg (not administered)  diphenhydrAMINE (BENADRYL) injection 25 mg (not administered)    Discharge Medication List as of 07/07/2014 10:43 AM         Britt Bottom, NP 07/07/14 1950

## 2014-07-07 NOTE — Discharge Instructions (Signed)
Please follow the directions provided.  Be sure to follow-up with your primary care provider regarding this headache to discuss daily prevention medicines.  Feel free to return if you have any new, worsening or concerning symptoms.    SEEK IMMEDIATE MEDICAL CARE IF:  Your headache becomes severe.  You have a fever.  You have a stiff neck.  You have loss of vision.  You have muscular weakness or loss of muscle control.  You start losing your balance or have trouble walking.  You feel faint or pass out.  You have severe symptoms that are different from your first symptoms.

## 2014-07-07 NOTE — ED Notes (Signed)
Pt c/o last night waking up with migraine headache; not feeling good x 1 week; increased bp meds; took tylenol pm with no relief; feels off balance; nauseated with no vomiting; c/o numbness and tingling left hand--started yesterday; history of migraines

## 2014-07-21 ENCOUNTER — Encounter (HOSPITAL_COMMUNITY): Payer: Self-pay | Admitting: Emergency Medicine

## 2014-07-21 ENCOUNTER — Emergency Department (HOSPITAL_COMMUNITY)
Admission: EM | Admit: 2014-07-21 | Discharge: 2014-07-21 | Discharge status: Against Medical Advice | Payer: Medicaid Other | Source: Home / Self Care

## 2014-07-21 ENCOUNTER — Emergency Department (HOSPITAL_COMMUNITY)
Admission: EM | Admit: 2014-07-21 | Discharge: 2014-07-21 | Disposition: A | Discharge status: Home / Self Care | Payer: Medicaid Other | Attending: Emergency Medicine | Admitting: Emergency Medicine

## 2014-07-21 ENCOUNTER — Emergency Department (HOSPITAL_COMMUNITY): Payer: Medicaid Other

## 2014-07-21 DIAGNOSIS — S299XXA Unspecified injury of thorax, initial encounter: Secondary | ICD-10-CM | POA: Insufficient documentation

## 2014-07-21 DIAGNOSIS — Y998 Other external cause status: Secondary | ICD-10-CM

## 2014-07-21 DIAGNOSIS — I10 Essential (primary) hypertension: Secondary | ICD-10-CM | POA: Diagnosis not present

## 2014-07-21 DIAGNOSIS — Z720 Tobacco use: Secondary | ICD-10-CM

## 2014-07-21 DIAGNOSIS — Y92410 Unspecified street and highway as the place of occurrence of the external cause: Secondary | ICD-10-CM

## 2014-07-21 DIAGNOSIS — Z87440 Personal history of urinary (tract) infections: Secondary | ICD-10-CM | POA: Diagnosis not present

## 2014-07-21 DIAGNOSIS — S29091A Other injury of muscle and tendon of front wall of thorax, initial encounter: Secondary | ICD-10-CM

## 2014-07-21 DIAGNOSIS — S161XXA Strain of muscle, fascia and tendon at neck level, initial encounter: Secondary | ICD-10-CM | POA: Insufficient documentation

## 2014-07-21 DIAGNOSIS — F329 Major depressive disorder, single episode, unspecified: Secondary | ICD-10-CM | POA: Diagnosis not present

## 2014-07-21 DIAGNOSIS — Z8719 Personal history of other diseases of the digestive system: Secondary | ICD-10-CM | POA: Insufficient documentation

## 2014-07-21 DIAGNOSIS — Y9389 Activity, other specified: Secondary | ICD-10-CM | POA: Insufficient documentation

## 2014-07-21 DIAGNOSIS — S46911A Strain of unspecified muscle, fascia and tendon at shoulder and upper arm level, right arm, initial encounter: Secondary | ICD-10-CM

## 2014-07-21 DIAGNOSIS — R079 Chest pain, unspecified: Secondary | ICD-10-CM

## 2014-07-21 DIAGNOSIS — Z791 Long term (current) use of non-steroidal anti-inflammatories (NSAID): Secondary | ICD-10-CM | POA: Diagnosis not present

## 2014-07-21 DIAGNOSIS — F419 Anxiety disorder, unspecified: Secondary | ICD-10-CM | POA: Insufficient documentation

## 2014-07-21 DIAGNOSIS — S0990XA Unspecified injury of head, initial encounter: Secondary | ICD-10-CM | POA: Insufficient documentation

## 2014-07-21 DIAGNOSIS — Z79899 Other long term (current) drug therapy: Secondary | ICD-10-CM | POA: Diagnosis not present

## 2014-07-21 DIAGNOSIS — Z86010 Personal history of colonic polyps: Secondary | ICD-10-CM | POA: Insufficient documentation

## 2014-07-21 LAB — BASIC METABOLIC PANEL
Anion gap: 12 (ref 5–15)
BUN: 7 mg/dL (ref 6–23)
CO2: 26 mEq/L (ref 19–32)
Calcium: 9.3 mg/dL (ref 8.4–10.5)
Chloride: 98 mEq/L (ref 96–112)
Creatinine, Ser: 0.74 mg/dL (ref 0.50–1.10)
GFR calc Af Amer: >90 mL/min (ref >90)
GFR calc non Af Amer: >90 mL/min (ref >90)
Glucose, Bld: 85 mg/dL (ref 70–99)
Potassium: 3.6 mEq/L — ABNORMAL LOW (ref 3.7–5.3)
Sodium: 136 mEq/L — ABNORMAL LOW (ref 137–147)

## 2014-07-21 LAB — CBC
HCT: 40.8 % (ref 36.0–46.0)
Hemoglobin: 13.5 g/dL (ref 12.0–15.0)
MCH: 30 pg (ref 26.0–34.0)
MCHC: 33.1 g/dL (ref 30.0–36.0)
MCV: 90.7 fL (ref 78.0–100.0)
Platelets: 298 10*3/uL (ref 150–400)
RBC: 4.5 MIL/uL (ref 3.87–5.11)
RDW: 13.8 % (ref 11.5–15.5)
WBC: 5.8 10*3/uL (ref 4.0–10.5)

## 2014-07-21 LAB — I-STAT TROPONIN, ED: Troponin i, poc: 0 ng/mL (ref 0.00–0.08)

## 2014-07-21 MED ORDER — OXYCODONE-ACETAMINOPHEN 5-325 MG PO TABS
1.0000 | ORAL_TABLET | Freq: Once | ORAL | Status: AC
  Administered 2014-07-21: 1 via ORAL
  Filled 2014-07-21: qty 1
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
## 2014-07-21 NOTE — Discharge Instructions (Signed)
Cervical Sprain °A cervical sprain is an injury in the neck in which the strong, fibrous tissues (ligaments) that connect your neck bones stretch or tear. Cervical sprains can range from mild to severe. Severe cervical sprains can cause the neck vertebrae to be unstable. This can lead to damage of the spinal cord and can result in serious nervous system problems. The amount of time it takes for a cervical sprain to get better depends on the cause and extent of the injury. Most cervical sprains heal in 1 to 3 weeks. °CAUSES  °Severe cervical sprains may be caused by:  °· Contact sport injuries (such as from football, rugby, wrestling, hockey, auto racing, gymnastics, diving, martial arts, or boxing).   °· Motor vehicle collisions.   °· Whiplash injuries. This is an injury from a sudden forward and backward whipping movement of the head and neck.  °· Falls.   °Mild cervical sprains may be caused by:  °· Being in an awkward position, such as while cradling a telephone between your ear and shoulder.   °· Sitting in a chair that does not offer proper support.   °· Working at a poorly designed computer station.   °· Looking up or down for long periods of time.   °SYMPTOMS  °· Pain, soreness, stiffness, or a burning sensation in the front, back, or sides of the neck. This discomfort may develop immediately after the injury or slowly, 24 hours or more after the injury.   °· Pain or tenderness directly in the middle of the back of the neck.   °· Shoulder or upper back pain.   °· Limited ability to move the neck.   °· Headache.   °· Dizziness.   °· Weakness, numbness, or tingling in the hands or arms.   °· Muscle spasms.   °· Difficulty swallowing or chewing.   °· Tenderness and swelling of the neck.   °DIAGNOSIS  °Most of the time your health care provider can diagnose a cervical sprain by taking your history and doing a physical exam. Your health care provider will ask about previous neck injuries and any known neck  problems, such as arthritis in the neck. X-rays may be taken to find out if there are any other problems, such as with the bones of the neck. Other tests, such as a CT scan or MRI, may also be needed.  °TREATMENT  °Treatment depends on the severity of the cervical sprain. Mild sprains can be treated with rest, keeping the neck in place (immobilization), and pain medicines. Severe cervical sprains are immediately immobilized. Further treatment is done to help with pain, muscle spasms, and other symptoms and may include: °· Medicines, such as pain relievers, numbing medicines, or muscle relaxants.   °· Physical therapy. This may involve stretching exercises, strengthening exercises, and posture training. Exercises and improved posture can help stabilize the neck, strengthen muscles, and help stop symptoms from returning.   °HOME CARE INSTRUCTIONS  °· Put ice on the injured area.   °¨ Put ice in a plastic bag.   °¨ Place a towel between your skin and the bag.   °¨ Leave the ice on for 15-20 minutes, 3-4 times a day.   °· If your injury was severe, you may have been given a cervical collar to wear. A cervical collar is a two-piece collar designed to keep your neck from moving while it heals. °¨ Do not remove the collar unless instructed by your health care provider. °¨ If you have long hair, keep it outside of the collar. °¨ Ask your health care provider before making any adjustments to your collar. Minor   adjustments may be required over time to improve comfort and reduce pressure on your chin or on the back of your head. °¨ If you are allowed to remove the collar for cleaning or bathing, follow your health care provider's instructions on how to do so safely. °¨ Keep your collar clean by wiping it with mild soap and water and drying it completely. If the collar you have been given includes removable pads, remove them every 1-2 days and hand wash them with soap and water. Allow them to air dry. They should be completely  dry before you wear them in the collar. °¨ If you are allowed to remove the collar for cleaning and bathing, wash and dry the skin of your neck. Check your skin for irritation or sores. If you see any, tell your health care provider. °¨ Do not drive while wearing the collar.   °· Only take over-the-counter or prescription medicines for pain, discomfort, or fever as directed by your health care provider.   °· Keep all follow-up appointments as directed by your health care provider.   °· Keep all physical therapy appointments as directed by your health care provider.   °· Make any needed adjustments to your workstation to promote good posture.   °· Avoid positions and activities that make your symptoms worse.   °· Warm up and stretch before being active to help prevent problems.   °SEEK MEDICAL CARE IF:  °· Your pain is not controlled with medicine.   °· You are unable to decrease your pain medicine over time as planned.   °· Your activity level is not improving as expected.   °SEEK IMMEDIATE MEDICAL CARE IF:  °· You develop any bleeding. °· You develop stomach upset. °· You have signs of an allergic reaction to your medicine.   °· Your symptoms get worse.   °· You develop new, unexplained symptoms.   °· You have numbness, tingling, weakness, or paralysis in any part of your body.   °MAKE SURE YOU:  °· Understand these instructions. °· Will watch your condition. °· Will get help right away if you are not doing well or get worse. °Document Released: 06/18/2007 Document Revised: 08/26/2013 Document Reviewed: 02/26/2013 °ExitCare® Patient Information ©2015 [employer]. This information is not intended to replace advice given to you by your health care provider. Make sure you discuss any questions you have with your health care provider. ° °Motor Vehicle Collision °It is common to have multiple bruises and sore muscles after a motor vehicle collision (MVC). These tend to feel worse for the first 24 hours. You may have  the most stiffness and soreness over the first several hours. You may also feel worse when you wake up the first morning after your collision. After this point, you will usually begin to improve with each day. The speed of improvement often depends on the severity of the collision, the number of injuries, and the location and nature of these injuries. °HOME CARE INSTRUCTIONS °· Put ice on the injured area. °¨ Put ice in a plastic bag. °¨ Place a towel between your skin and the bag. °¨ Leave the ice on for 15-20 minutes, 3-4 times a day, or as directed by your health care provider. °· Drink enough fluids to keep your urine clear or pale yellow. Do not drink alcohol. °· Take a warm shower or bath once or twice a day. This will increase blood flow to sore muscles. °· You may return to activities as directed by your caregiver. Be careful when lifting, as this may aggravate neck or back   pain. °· Only take over-the-counter or prescription medicines for pain, discomfort, or fever as directed by your caregiver. Do not use aspirin. This may increase bruising and bleeding. °SEEK IMMEDIATE MEDICAL CARE IF: °· You have numbness, tingling, or weakness in the arms or legs. °· You develop severe headaches not relieved with medicine. °· You have severe neck pain, especially tenderness in the middle of the back of your neck. °· You have changes in bowel or bladder control. °· There is increasing pain in any area of the body. °· You have shortness of breath, light-headedness, dizziness, or fainting. °· You have chest pain. °· You feel sick to your stomach (nauseous), throw up (vomit), or sweat. °· You have increasing abdominal discomfort. °· There is blood in your urine, stool, or vomit. °· You have pain in your shoulder (shoulder strap areas). °· You feel your symptoms are getting worse. °MAKE SURE YOU: °· Understand these instructions. °· Will watch your condition. °· Will get help right away if you are not doing well or get  worse. °Document Released: 08/21/2005 Document Revised: 01/05/2014 Document Reviewed: 01/18/2011 °ExitCare® Patient Information ©2015 [employer]. This information is not intended to replace advice given to you by your health care provider. Make sure you discuss any questions you have with your health care provider. ° °
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
## 2014-08-13 ENCOUNTER — Encounter: Payer: Self-pay | Admitting: Medical

## 2014-08-13 ENCOUNTER — Ambulatory Visit (INDEPENDENT_AMBULATORY_CARE_PROVIDER_SITE_OTHER): Payer: Medicaid Other | Admitting: Medical

## 2014-08-13 ENCOUNTER — Other Ambulatory Visit (HOSPITAL_COMMUNITY)
Admission: RE | Admit: 2014-08-13 | Discharge: 2014-08-13 | Disposition: A | Discharge status: Home / Self Care | Payer: Medicaid Other | Source: Ambulatory Visit | Attending: Family Medicine | Admitting: Family Medicine

## 2014-08-13 VITALS — BP 131/77 | HR 80 | Temp 98.4°F | Ht 68.0 in | Wt 310.8 lb

## 2014-08-13 DIAGNOSIS — Z1151 Encounter for screening for human papillomavirus (HPV): Secondary | ICD-10-CM | POA: Diagnosis present

## 2014-08-13 DIAGNOSIS — Z01419 Encounter for gynecological examination (general) (routine) without abnormal findings: Secondary | ICD-10-CM

## 2014-08-13 DIAGNOSIS — Z0000 Encounter for general adult medical examination without abnormal findings: Secondary | ICD-10-CM

## 2014-08-13 DIAGNOSIS — Z1272 Encounter for screening for malignant neoplasm of vagina: Secondary | ICD-10-CM | POA: Diagnosis not present
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
## 2014-08-13 NOTE — Patient Instructions (Signed)
Pap Test A Pap test is a procedure done in a clinic office to evaluate cells that are on the surface of the cervix. The cervix is the lower portion of the uterus and upper portion of the vagina. For some women, the cervical region has the potential to form cancer. With consistent evaluations by your caregiver, this type of cancer can be prevented.  If a Pap test is abnormal, it is most often a result of a previous exposure to human papillomavirus (HPV). HPV is a virus that can infect the cells of the cervix and cause dysplasia. Dysplasia is where the cells no longer look normal. If a woman has been diagnosed with high-grade or severe dysplasia, they are at higher risk of developing cervical cancer. People diagnosed with low-grade dysplasia should still be seen by their caregiver because there is a small chance that low-grade dysplasia could develop into cancer.  LET YOUR CAREGIVER KNOW ABOUT:  Recent sexually transmitted infection (STI) you have had.  Any new sex partners you have had.  History of previous abnormal Pap tests results.  History of previous cervical procedures you have had (colposcopy, biopsy, loop electrosurgical excision procedure [LEEP]).  Concerns you have had regarding unusual vaginal discharge.  History of pelvic pain.  Your use of birth control. BEFORE THE PROCEDURE  Ask your caregiver when to schedule your Pap test. It is best not to be on your period if your caregiver uses a wooden spatula to collect cells or applies cells to a glass slide. Newer techniques are not so sensitive to the timing of a menstrual cycle.  Do not douche or have sexual intercourse for 24 hours before the test.   Do not use vaginal creams or tampons for 24 hours before the test.   Empty your bladder just before the test to lessen any discomfort.  PROCEDURE You will lie on an exam table with your feet in stirrups. A warm metal or plastic instrument (speculum) is placed in your vagina. This  instrument allows your caregiver to see the inside of your vagina and look at your cervix. A small, plastic brush or wooden spatula is then used to collect cervical cells. These cells are placed in a lab specimen container. The cells are looked at under a microscope. A specialist will determine if the cells are normal.  AFTER THE PROCEDURE Make sure to get your test results.If your results come back abnormal, you may need further testing.  Document Released: 11/11/2002 Document Revised: 11/13/2011 Document Reviewed: 08/17/2011 [employer] Patient Information 2015 [employer]. This information is not intended to replace advice given to you by your health care provider. Make sure you discuss any questions you have with your health care provider.  

## 2014-08-14 LAB — CYTOLOGY - PAP

## 2017-06-19 ENCOUNTER — Emergency Department (HOSPITAL_COMMUNITY)
Admission: EM | Admit: 2017-06-19 | Discharge: 2017-06-19 | Disposition: A | Discharge status: Home / Self Care | Payer: Medicaid Other | Attending: Emergency Medicine | Admitting: Emergency Medicine

## 2017-06-19 ENCOUNTER — Encounter (HOSPITAL_COMMUNITY): Payer: Self-pay | Admitting: *Deleted

## 2017-06-19 DIAGNOSIS — F17210 Nicotine dependence, cigarettes, uncomplicated: Secondary | ICD-10-CM | POA: Insufficient documentation

## 2017-06-19 DIAGNOSIS — Z79899 Other long term (current) drug therapy: Secondary | ICD-10-CM | POA: Diagnosis not present

## 2017-06-19 DIAGNOSIS — I10 Essential (primary) hypertension: Secondary | ICD-10-CM | POA: Insufficient documentation

## 2017-06-19 DIAGNOSIS — M545 Low back pain: Secondary | ICD-10-CM | POA: Diagnosis present

## 2017-06-19 DIAGNOSIS — M5441 Lumbago with sciatica, right side: Secondary | ICD-10-CM

## 2017-06-19 MED ORDER — CYCLOBENZAPRINE HCL 10 MG PO TABS: 10.0000 mg | ORAL_TABLET | Freq: Two times a day (BID) | ORAL | 0 refills | Status: AC | PRN

## 2017-06-19 MED ORDER — TRAMADOL HCL 50 MG PO TABS
50.0000 mg | ORAL_TABLET | Freq: Four times a day (QID) | ORAL | 0 refills | Status: DC | PRN
Start: 2017-06-19 — End: 2020-04-14

## 2017-06-19 MED ORDER — PREDNISONE 20 MG PO TABS: ORAL_TABLET | ORAL | 0 refills | Status: DC

## 2017-06-19 NOTE — ED Triage Notes (Signed)
Pt complains of back pain radiating to her right foot. Pt states she has pain and intermittent numbness, which caused her to fall twice in the past five days.
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
## 2017-06-19 NOTE — ED Notes (Signed)
Bed: WTR5 Expected date:  Expected time:  Means of arrival:  Comments: 

## 2018-01-21 ENCOUNTER — Other Ambulatory Visit: Payer: Self-pay | Admitting: Radiology

## 2018-01-30 ENCOUNTER — Ambulatory Visit: Payer: Self-pay | Admitting: General Surgery

## 2018-01-30 DIAGNOSIS — N6021 Fibroadenosis of right breast: Secondary | ICD-10-CM

## 2018-03-06 ENCOUNTER — Encounter (HOSPITAL_BASED_OUTPATIENT_CLINIC_OR_DEPARTMENT_OTHER): Payer: Self-pay | Admitting: *Deleted

## 2018-03-06 ENCOUNTER — Other Ambulatory Visit: Payer: Self-pay

## 2018-03-06 NOTE — Progress Notes (Signed)
Pt states she has an appointment for seed at [facility] on Tuesday 03/12/18 at 1:50

## 2018-03-06 NOTE — Progress Notes (Signed)
Pt states she used to be on CPAP but does not need it now that she has had gastric bypass, pt took herself off cpap due to inability to sleep

## 2018-03-06 NOTE — Progress Notes (Signed)
Spoke with Bailey Russell at [facility], where pt states she had an Echo, will check chart and fax to [place] if present

## 2018-03-12 DIAGNOSIS — J341 Cyst and mucocele of nose and nasal sinus: Secondary | ICD-10-CM | POA: Diagnosis not present

## 2018-03-12 DIAGNOSIS — R51 Headache: Secondary | ICD-10-CM | POA: Diagnosis not present

## 2018-03-12 DIAGNOSIS — J310 Chronic rhinitis: Secondary | ICD-10-CM | POA: Diagnosis not present

## 2018-03-13 ENCOUNTER — Encounter (HOSPITAL_BASED_OUTPATIENT_CLINIC_OR_DEPARTMENT_OTHER)
Admission: RE | Admit: 2018-03-13 | Discharge: 2018-03-13 | Disposition: A | Discharge status: Home / Self Care | Payer: Medicaid Other | Source: Ambulatory Visit | Attending: General Surgery | Admitting: General Surgery

## 2018-03-13 DIAGNOSIS — Z01818 Encounter for other preprocedural examination: Secondary | ICD-10-CM | POA: Diagnosis not present

## 2018-03-13 DIAGNOSIS — F329 Major depressive disorder, single episode, unspecified: Secondary | ICD-10-CM | POA: Diagnosis not present

## 2018-03-13 DIAGNOSIS — F419 Anxiety disorder, unspecified: Secondary | ICD-10-CM | POA: Diagnosis not present

## 2018-03-13 DIAGNOSIS — F17200 Nicotine dependence, unspecified, uncomplicated: Secondary | ICD-10-CM | POA: Diagnosis not present

## 2018-03-13 DIAGNOSIS — N6021 Fibroadenosis of right breast: Secondary | ICD-10-CM | POA: Insufficient documentation

## 2018-03-13 DIAGNOSIS — G4730 Sleep apnea, unspecified: Secondary | ICD-10-CM | POA: Diagnosis not present

## 2018-03-13 DIAGNOSIS — N6489 Other specified disorders of breast: Secondary | ICD-10-CM | POA: Diagnosis not present

## 2018-03-13 DIAGNOSIS — Z9884 Bariatric surgery status: Secondary | ICD-10-CM | POA: Diagnosis not present

## 2018-03-13 DIAGNOSIS — I10 Essential (primary) hypertension: Secondary | ICD-10-CM | POA: Diagnosis not present

## 2018-03-13 DIAGNOSIS — G43909 Migraine, unspecified, not intractable, without status migrainosus: Secondary | ICD-10-CM | POA: Diagnosis not present

## 2018-03-13 DIAGNOSIS — Z79899 Other long term (current) drug therapy: Secondary | ICD-10-CM | POA: Diagnosis not present

## 2018-03-13 DIAGNOSIS — Z8673 Personal history of transient ischemic attack (TIA), and cerebral infarction without residual deficits: Secondary | ICD-10-CM | POA: Diagnosis not present

## 2018-03-13 LAB — BASIC METABOLIC PANEL
Anion gap: 5 (ref 5–15)
BUN: 5 mg/dL — ABNORMAL LOW (ref 6–20)
CO2: 26 mmol/L (ref 22–32)
Calcium: 9.1 mg/dL (ref 8.9–10.3)
Chloride: 110 mmol/L (ref 98–111)
Creatinine, Ser: 0.89 mg/dL (ref 0.44–1.00)
GFR calc Af Amer: >60 mL/min (ref >60)
GFR calc non Af Amer: >60 mL/min (ref >60)
Glucose, Bld: 98 mg/dL (ref 70–99)
Potassium: 4.4 mmol/L (ref 3.5–5.1)
Sodium: 141 mmol/L (ref 135–145)

## 2018-03-13 NOTE — Progress Notes (Signed)
Ensure pre surgery drink given with instructions to complete by 0515 dos, pt verbalized understanding. 

## 2018-03-14 ENCOUNTER — Ambulatory Visit (HOSPITAL_BASED_OUTPATIENT_CLINIC_OR_DEPARTMENT_OTHER): Payer: Medicare Other | Admitting: Anesthesiology

## 2018-03-14 ENCOUNTER — Encounter (HOSPITAL_BASED_OUTPATIENT_CLINIC_OR_DEPARTMENT_OTHER): Payer: Self-pay | Admitting: Anesthesiology

## 2018-03-14 ENCOUNTER — Ambulatory Visit (HOSPITAL_BASED_OUTPATIENT_CLINIC_OR_DEPARTMENT_OTHER)
Admission: RE | Admit: 2018-03-14 | Discharge: 2018-03-14 | Disposition: A | Discharge status: Home / Self Care | Payer: Medicare Other | Source: Ambulatory Visit | Attending: General Surgery | Admitting: General Surgery

## 2018-03-14 ENCOUNTER — Other Ambulatory Visit: Payer: Self-pay

## 2018-03-14 ENCOUNTER — Encounter (HOSPITAL_BASED_OUTPATIENT_CLINIC_OR_DEPARTMENT_OTHER)
Admission: RE | Disposition: A | Discharge status: Home / Self Care | Payer: Self-pay | Source: Ambulatory Visit | Attending: General Surgery

## 2018-03-14 DIAGNOSIS — Z79899 Other long term (current) drug therapy: Secondary | ICD-10-CM | POA: Diagnosis not present

## 2018-03-14 DIAGNOSIS — F17200 Nicotine dependence, unspecified, uncomplicated: Secondary | ICD-10-CM | POA: Insufficient documentation

## 2018-03-14 DIAGNOSIS — I10 Essential (primary) hypertension: Secondary | ICD-10-CM | POA: Insufficient documentation

## 2018-03-14 DIAGNOSIS — Z9884 Bariatric surgery status: Secondary | ICD-10-CM | POA: Insufficient documentation

## 2018-03-14 DIAGNOSIS — Z8673 Personal history of transient ischemic attack (TIA), and cerebral infarction without residual deficits: Secondary | ICD-10-CM | POA: Insufficient documentation

## 2018-03-14 DIAGNOSIS — F329 Major depressive disorder, single episode, unspecified: Secondary | ICD-10-CM | POA: Diagnosis not present

## 2018-03-14 DIAGNOSIS — G4730 Sleep apnea, unspecified: Secondary | ICD-10-CM | POA: Insufficient documentation

## 2018-03-14 DIAGNOSIS — N6489 Other specified disorders of breast: Secondary | ICD-10-CM | POA: Diagnosis not present

## 2018-03-14 DIAGNOSIS — G43909 Migraine, unspecified, not intractable, without status migrainosus: Secondary | ICD-10-CM | POA: Diagnosis not present

## 2018-03-14 DIAGNOSIS — F419 Anxiety disorder, unspecified: Secondary | ICD-10-CM | POA: Insufficient documentation

## 2018-03-14 HISTORY — DX: Sleep apnea, unspecified: G47.30

## 2018-03-14 HISTORY — PX: BREAST LUMPECTOMY WITH RADIOACTIVE SEED LOCALIZATION: SHX6424

## 2018-03-14 HISTORY — DX: Pneumonia, unspecified organism: J18.9

## 2018-03-14 LAB — POCT PREGNANCY, URINE: Preg Test, Ur: NEGATIVE

## 2018-03-14 SURGERY — BREAST LUMPECTOMY WITH RADIOACTIVE SEED LOCALIZATION
Anesthesia: General | Site: Breast | Laterality: Right

## 2018-03-14 MED ORDER — MEPERIDINE HCL 25 MG/ML IJ SOLN: 6.2500 mg | INTRAMUSCULAR | Status: DC | PRN

## 2018-03-14 MED ORDER — KETOROLAC TROMETHAMINE 30 MG/ML IJ SOLN: 30.0000 mg | Freq: Once | INTRAMUSCULAR | Status: DC | PRN

## 2018-03-14 MED ORDER — GABAPENTIN 300 MG PO CAPS
ORAL_CAPSULE | ORAL | Status: AC
  Filled 2018-03-14: qty 1

## 2018-03-14 MED ORDER — PROPOFOL 10 MG/ML IV BOLUS
INTRAVENOUS | Status: DC | PRN
  Administered 2018-03-14: 150 mg via INTRAVENOUS

## 2018-03-14 MED ORDER — ACETAMINOPHEN 500 MG PO TABS
ORAL_TABLET | ORAL | Status: AC
  Filled 2018-03-14: qty 2

## 2018-03-14 MED ORDER — SCOPOLAMINE 1 MG/3DAYS TD PT72: 1.0000 | MEDICATED_PATCH | Freq: Once | TRANSDERMAL | Status: DC | PRN

## 2018-03-14 MED ORDER — FENTANYL CITRATE (PF) 100 MCG/2ML IJ SOLN
INTRAMUSCULAR | Status: DC | PRN
  Administered 2018-03-14: 100 ug via INTRAVENOUS

## 2018-03-14 MED ORDER — FENTANYL CITRATE (PF) 100 MCG/2ML IJ SOLN: 25.0000 ug | INTRAMUSCULAR | Status: DC | PRN

## 2018-03-14 MED ORDER — LACTATED RINGERS IV SOLN
INTRAVENOUS | Status: DC
  Administered 2018-03-14 (×2): via INTRAVENOUS

## 2018-03-14 MED ORDER — ACETAMINOPHEN 160 MG/5ML PO SOLN: 325.0000 mg | ORAL | Status: DC | PRN

## 2018-03-14 MED ORDER — ONDANSETRON HCL 4 MG/2ML IJ SOLN: 4.0000 mg | Freq: Once | INTRAMUSCULAR | Status: DC | PRN

## 2018-03-14 MED ORDER — MIDAZOLAM HCL 2 MG/2ML IJ SOLN
INTRAMUSCULAR | Status: AC
  Filled 2018-03-14: qty 2

## 2018-03-14 MED ORDER — HYDROCODONE-ACETAMINOPHEN 5-325 MG PO TABS: 1.0000 | ORAL_TABLET | Freq: Four times a day (QID) | ORAL | 0 refills | Status: DC | PRN

## 2018-03-14 MED ORDER — FENTANYL CITRATE (PF) 100 MCG/2ML IJ SOLN
INTRAMUSCULAR | Status: AC
  Filled 2018-03-14: qty 2

## 2018-03-14 MED ORDER — ACETAMINOPHEN 325 MG PO TABS: 325.0000 mg | ORAL_TABLET | ORAL | Status: DC | PRN

## 2018-03-14 MED ORDER — CHLORHEXIDINE GLUCONATE CLOTH 2 % EX PADS: 6.0000 | MEDICATED_PAD | Freq: Once | CUTANEOUS | Status: DC

## 2018-03-14 MED ORDER — OXYCODONE HCL 5 MG PO TABS: 5.0000 mg | ORAL_TABLET | Freq: Once | ORAL | Status: DC | PRN

## 2018-03-14 MED ORDER — PROPOFOL 10 MG/ML IV BOLUS
INTRAVENOUS | Status: AC
  Filled 2018-03-14: qty 20

## 2018-03-14 MED ORDER — MIDAZOLAM HCL 2 MG/2ML IJ SOLN: 1.0000 mg | INTRAMUSCULAR | Status: DC | PRN

## 2018-03-14 MED ORDER — CEFAZOLIN SODIUM-DEXTROSE 2-4 GM/100ML-% IV SOLN
2.0000 g | INTRAVENOUS | Status: AC
  Administered 2018-03-14: 2 g via INTRAVENOUS

## 2018-03-14 MED ORDER — CELECOXIB 200 MG PO CAPS
200.0000 mg | ORAL_CAPSULE | ORAL | Status: AC
  Administered 2018-03-14: 200 mg via ORAL

## 2018-03-14 MED ORDER — CEFAZOLIN SODIUM-DEXTROSE 2-4 GM/100ML-% IV SOLN
INTRAVENOUS | Status: AC
  Filled 2018-03-14: qty 100

## 2018-03-14 MED ORDER — GABAPENTIN 300 MG PO CAPS
300.0000 mg | ORAL_CAPSULE | ORAL | Status: AC
  Administered 2018-03-14: 300 mg via ORAL

## 2018-03-14 MED ORDER — MIDAZOLAM HCL 5 MG/5ML IJ SOLN
INTRAMUSCULAR | Status: DC | PRN
  Administered 2018-03-14: 2 mg via INTRAVENOUS

## 2018-03-14 MED ORDER — ACETAMINOPHEN 500 MG PO TABS
1000.0000 mg | ORAL_TABLET | ORAL | Status: AC
  Administered 2018-03-14: 1000 mg via ORAL

## 2018-03-14 MED ORDER — LIDOCAINE HCL (CARDIAC) PF 100 MG/5ML IV SOSY
PREFILLED_SYRINGE | INTRAVENOUS | Status: AC
  Filled 2018-03-14: qty 5

## 2018-03-14 MED ORDER — BUPIVACAINE-EPINEPHRINE (PF) 0.25% -1:200000 IJ SOLN
INTRAMUSCULAR | Status: DC | PRN
  Administered 2018-03-14: 20 mL

## 2018-03-14 MED ORDER — ONDANSETRON HCL 4 MG/2ML IJ SOLN
INTRAMUSCULAR | Status: DC | PRN
  Administered 2018-03-14: 4 mg via INTRAVENOUS

## 2018-03-14 MED ORDER — DEXAMETHASONE SODIUM PHOSPHATE 10 MG/ML IJ SOLN
INTRAMUSCULAR | Status: AC
  Filled 2018-03-14: qty 1

## 2018-03-14 MED ORDER — OXYCODONE HCL 5 MG/5ML PO SOLN: 5.0000 mg | Freq: Once | ORAL | Status: DC | PRN

## 2018-03-14 MED ORDER — LIDOCAINE HCL (CARDIAC) PF 100 MG/5ML IV SOSY
PREFILLED_SYRINGE | INTRAVENOUS | Status: DC | PRN
  Administered 2018-03-14: 50 mg via INTRAVENOUS

## 2018-03-14 MED ORDER — GABAPENTIN 300 MG PO CAPS
300.0000 mg | ORAL_CAPSULE | Freq: Once | ORAL | Status: AC
  Administered 2018-03-14: 300 mg via ORAL

## 2018-03-14 MED ORDER — ONDANSETRON HCL 4 MG/2ML IJ SOLN
INTRAMUSCULAR | Status: AC
  Filled 2018-03-14: qty 2

## 2018-03-14 MED ORDER — DEXAMETHASONE SODIUM PHOSPHATE 4 MG/ML IJ SOLN
INTRAMUSCULAR | Status: DC | PRN
  Administered 2018-03-14: 10 mg via INTRAVENOUS

## 2018-03-14 MED ORDER — CELECOXIB 200 MG PO CAPS
ORAL_CAPSULE | ORAL | Status: AC
  Filled 2018-03-14: qty 1

## 2018-03-14 MED ORDER — FENTANYL CITRATE (PF) 100 MCG/2ML IJ SOLN: 50.0000 ug | INTRAMUSCULAR | Status: DC | PRN

## 2018-03-14 SURGICAL SUPPLY — 42 items
APPLIER CLIP 9.375 MED OPEN (MISCELLANEOUS)
BLADE SURG 15 STRL LF DISP TIS (BLADE) ×1 IMPLANT
BLADE SURG 15 STRL SS (BLADE) ×1
CANISTER SUC SOCK COL 7IN (MISCELLANEOUS) ×2 IMPLANT
CANISTER SUCT 1200ML W/VALVE (MISCELLANEOUS) ×2 IMPLANT
CHLORAPREP W/TINT 26ML (MISCELLANEOUS) ×2 IMPLANT
CLIP APPLIE 9.375 MED OPEN (MISCELLANEOUS) IMPLANT
COVER BACK TABLE 60X90IN (DRAPES) ×2 IMPLANT
COVER MAYO STAND STRL (DRAPES) ×2 IMPLANT
COVER PROBE W GEL 5X96 (DRAPES) ×2 IMPLANT
DECANTER SPIKE VIAL GLASS SM (MISCELLANEOUS) IMPLANT
DERMABOND ADVANCED (GAUZE/BANDAGES/DRESSINGS) ×1
DERMABOND ADVANCED .7 DNX12 (GAUZE/BANDAGES/DRESSINGS) ×1 IMPLANT
DEVICE DUBIN W/COMP PLATE 8390 (MISCELLANEOUS) ×2 IMPLANT
DRAPE LAPAROSCOPIC ABDOMINAL (DRAPES) ×2 IMPLANT
DRAPE UTILITY XL STRL (DRAPES) ×2 IMPLANT
ELECT COATED BLADE 2.86 ST (ELECTRODE) ×2 IMPLANT
ELECT REM PT RETURN 9FT ADLT (ELECTROSURGICAL) ×2
ELECTRODE REM PT RTRN 9FT ADLT (ELECTROSURGICAL) ×1 IMPLANT
GLOVE BIO SURGEON STRL SZ7.5 (GLOVE) ×4 IMPLANT
GLOVE BIOGEL PI IND STRL 7.0 (GLOVE) ×2 IMPLANT
GLOVE BIOGEL PI INDICATOR 7.0 (GLOVE) ×2
GLOVE SURG SS PI 6.5 STRL IVOR (GLOVE) ×2 IMPLANT
GOWN STRL REUS W/ TWL LRG LVL3 (GOWN DISPOSABLE) ×2 IMPLANT
GOWN STRL REUS W/TWL LRG LVL3 (GOWN DISPOSABLE) ×2
ILLUMINATOR WAVEGUIDE N/F (MISCELLANEOUS) IMPLANT
KIT MARKER MARGIN INK (KITS) ×2 IMPLANT
LIGHT WAVEGUIDE WIDE FLAT (MISCELLANEOUS) IMPLANT
NEEDLE HYPO 25X1 1.5 SAFETY (NEEDLE) ×2 IMPLANT
NS IRRIG 1000ML POUR BTL (IV SOLUTION) ×2 IMPLANT
PACK BASIN DAY SURGERY FS (CUSTOM PROCEDURE TRAY) ×2 IMPLANT
PENCIL BUTTON HOLSTER BLD 10FT (ELECTRODE) ×2 IMPLANT
SLEEVE SCD COMPRESS KNEE MED (MISCELLANEOUS) ×2 IMPLANT
SPONGE LAP 18X18 RF (DISPOSABLE) ×2 IMPLANT
SUT MON AB 4-0 PC3 18 (SUTURE) ×2 IMPLANT
SUT SILK 2 0 SH (SUTURE) IMPLANT
SUT VICRYL 3-0 CR8 SH (SUTURE) ×2 IMPLANT
SYR CONTROL 10ML LL (SYRINGE) ×2 IMPLANT
TOWEL GREEN STERILE FF (TOWEL DISPOSABLE) ×2 IMPLANT
TOWEL OR NON WOVEN STRL DISP B (DISPOSABLE) ×2 IMPLANT
TUBE CONNECTING 20X1/4 (TUBING) ×2 IMPLANT
YANKAUER SUCT BULB TIP NO VENT (SUCTIONS) IMPLANT

## 2018-03-14 NOTE — Anesthesia Procedure Notes (Signed)
Procedure Name: LMA Insertion Date/Time: 03/14/2018 9:33 AM Performed by: Marrianne Mood, CRNA Pre-anesthesia Checklist: Patient identified, Emergency Drugs available, Suction available, Patient being monitored and Timeout performed Patient Re-evaluated:Patient Re-evaluated prior to induction Oxygen Delivery Method: Circle system utilized Preoxygenation: Pre-oxygenation with 100% oxygen Induction Type: IV induction Ventilation: Mask ventilation without difficulty LMA: LMA inserted LMA Size: 4.0 Number of attempts: 1 Airway Equipment and Method: Bite block Placement Confirmation: positive ETCO2 Tube secured with: Tape Dental Injury: Teeth and Oropharynx as per pre-operative assessment

## 2018-03-14 NOTE — Transfer of Care (Signed)
Immediate Anesthesia Transfer of Care Note  Patient: Bailey Russell  Procedure(s) Performed: BREAST LUMPECTOMY WITH RADIOACTIVE SEED LOCALIZATION (Right Breast)  Patient Location: PACU  Anesthesia Type:General  Level of Consciousness: awake and patient cooperative  Airway & Oxygen Therapy: Patient Spontanous Breathing and Patient connected to face mask oxygen  Post-op Assessment: Report given to RN and Post -op Vital signs reviewed and stable  Post vital signs: Reviewed and stable  Last Vitals:  Vitals Value Taken Time  BP    Temp    Pulse    Resp    SpO2      Last Pain:  Vitals:   03/14/18 0800  TempSrc: Oral  PainSc: 7          Complications: No apparent anesthesia complications

## 2018-03-14 NOTE — Discharge Instructions (Signed)
**  You had 1000 mg of Tylenol at 8:15 AM   Post Anesthesia Home Care Instructions  Activity: Get plenty of rest for the remainder of the day. A responsible individual must stay with you for 24 hours following the procedure.  For the next 24 hours, DO NOT: -Drive a car -Paediatric nurse -Drink alcoholic beverages -Take any medication unless instructed by your physician -Make any legal decisions or sign important papers.  Meals: Start with liquid foods such as gelatin or soup. Progress to regular foods as tolerated. Avoid greasy, spicy, heavy foods. If nausea and/or vomiting occur, drink only clear liquids until the nausea and/or vomiting subsides. Call your physician if vomiting continues.  Special Instructions/Symptoms: Your throat may feel dry or sore from the anesthesia or the breathing tube placed in your throat during surgery. If this causes discomfort, gargle with warm salt water. The discomfort should disappear within 24 hours.  If you had a scopolamine patch placed behind your ear for the management of post- operative nausea and/or vomiting:  1. The medication in the patch is effective for 72 hours, after which it should be removed.  Wrap patch in a tissue and discard in the trash. Wash hands thoroughly with soap and water. 2. You may remove the patch earlier than 72 hours if you experience unpleasant side effects which may include dry mouth, dizziness or visual disturbances. 3. Avoid touching the patch. Wash your hands with soap and water after contact with the patch.

## 2018-03-14 NOTE — Interval H&P Note (Signed)
History and Physical Interval Note:  03/14/2018 9:14 AM  Bailey Russell  has presented today for surgery, with the diagnosis of right breast complex sclerosing lesion  The various methods of treatment have been discussed with the patient and family. After consideration of risks, benefits and other options for treatment, the patient has consented to  Procedure(s): BREAST LUMPECTOMY WITH RADIOACTIVE SEED LOCALIZATION (Right) as a surgical intervention .  The patient's history has been reviewed, patient examined, no change in status, stable for surgery.  I have reviewed the patient's chart and labs.  Questions were answered to the patient's satisfaction.     TOTH III,Alline Pio S

## 2018-03-14 NOTE — Anesthesia Preprocedure Evaluation (Signed)
Anesthesia Evaluation  Patient identified by MRN, date of birth, ID band Patient awake    Reviewed: Allergy & Precautions, NPO status , Patient's Chart, lab work & pertinent test results  Airway Mallampati: I       Dental no notable dental hx. (+) Teeth Intact   Pulmonary Current Smoker,    Pulmonary exam normal breath sounds clear to auscultation       Cardiovascular hypertension, Pt. on medications and Pt. on home beta blockers Normal cardiovascular exam Rhythm:Regular Rate:Normal     Neuro/Psych PSYCHIATRIC DISORDERS Anxiety Depression    GI/Hepatic   Endo/Other  negative endocrine ROS  Renal/GU      Musculoskeletal  (+) Arthritis ,   Abdominal (+) + obese,   Peds  Hematology negative hematology ROS (+)   Anesthesia Other Findings   Reproductive/Obstetrics                             Anesthesia Physical Anesthesia Plan  ASA: II  Anesthesia Plan: General   Post-op Pain Management:    Induction: Intravenous  PONV Risk Score and Plan: 3 and Ondansetron, Dexamethasone and Midazolam  Airway Management Planned: LMA  Additional Equipment:   Intra-op Plan:   Post-operative Plan:   Informed Consent: I have reviewed the patients History and Physical, chart, labs and discussed the procedure including the risks, benefits and alternatives for the proposed anesthesia with the patient or authorized representative who has indicated his/her understanding and acceptance.   Dental advisory given  Plan Discussed with: CRNA and Surgeon  Anesthesia Plan Comments:         Anesthesia Quick Evaluation

## 2018-03-14 NOTE — H&P (Signed)
Bailey Russell  Location: [facility] Surgery Patient #: [id number] DOB: 05/03/1966 Widowed / Language: Cleophus Molt / Race: Black or African American Female   History of Present Illness The patient is a 52 year old female who presents with a breast mass. We are asked to see the patient in consultation by Dr. Marcelo Baldy to evaluate her for a complex sclerosing lesion of the right breast. The patient is a 52 year old black female who recently went for a routine screening mammogram. At that time she was found to have a 1.7 cm area of distortion and calcification in the upper outer right breast. This appeared highly suspicious. It was biopsied and came back as a complex sclerosing lesion. She denies any personal or family history of breast cancer. She does smoke.   Past Surgical History  Appendectomy  Breast Biopsy  Right. Colon Polyp Removal - Colonoscopy  Gallbladder Surgery - Laparoscopic  Gastric Bypass  Knee Surgery  Right. Oral Surgery  Tonsillectomy   Diagnostic Studies History  Colonoscopy  1-5 years ago Mammogram  within last year Pap Smear  1-5 years ago  Allergies  Naproxen *ANALGESICS - ANTI-INFLAMMATORY*  Lisinopril *ANTIHYPERTENSIVES*  Allergies Reconciled   Medication History  DiazePAM (5MG  Tablet, Oral) Active. ARIPiprazole (15MG  Tablet, Oral) Active. Baclofen (10MG  Tablet, Oral) Active. Gabapentin (600MG  Tablet, Oral) Active. Doxepin HCl (50MG  Capsule, Oral) Active. HydroCHLOROthiazide (25MG  Tablet, Oral) Active. LevoFLOXacin (500MG  Tablet, Oral) Active. Topiramate (200MG  Tablet, Oral) Active. Vraylar (3MG  Capsule, Oral) Active. Medications Reconciled  Social History  Alcohol use  Occasional alcohol use. Caffeine use  Carbonated beverages, Coffee, Tea. Illicit drug use  Prefer to discuss with provider. Tobacco use  Current every day smoker.  Family History  Arthritis  Family Members In General, Mother,  Sister. Cancer  Mother. Depression  Brother, Daughter, Mother, Sister. Hypertension  Brother, Daughter, Mother, Sister. Kidney Disease  Daughter. Migraine Headache  Daughter, Family Members In General, Mother.  Pregnancy / Birth History  Age at menarche  63 years. Contraceptive History  Depo-provera, Oral contraceptives. Gravida  6 Irregular periods  Maternal age  77-20 Para  4  Other Problems  Anxiety Disorder  Arthritis  Back Pain  Cerebrovascular Accident  Cholelithiasis  Diverticulosis  Gastroesophageal Reflux Disease  High blood pressure  Migraine Headache  Sleep Apnea     Review of Systems  General Present- Fatigue. Not Present- Appetite Loss, Chills, Fever, Night Sweats, Weight Gain and Weight Loss. Skin Not Present- Change in Wart/Mole, Dryness, Hives, Jaundice, New Lesions, Non-Healing Wounds, Rash and Ulcer. HEENT Present- Wears glasses/contact lenses. Not Present- Earache, Hearing Loss, Hoarseness, Nose Bleed, Oral Ulcers, Ringing in the Ears, Seasonal Allergies, Sinus Pain, Sore Throat, Visual Disturbances and Yellow Eyes. Respiratory Present- Wheezing. Not Present- Bloody sputum, Chronic Cough, Difficulty Breathing and Snoring. Breast Present- Breast Mass. Not Present- Breast Pain, Nipple Discharge and Skin Changes. Cardiovascular Not Present- Chest Pain, Difficulty Breathing Lying Down, Leg Cramps, Palpitations, Rapid Heart Rate, Shortness of Breath and Swelling of Extremities. Gastrointestinal Not Present- Abdominal Pain, Bloating, Bloody Stool, Change in Bowel Habits, Chronic diarrhea, Constipation, Difficulty Swallowing, Excessive gas, Gets full quickly at meals, Hemorrhoids, Indigestion, Nausea, Rectal Pain and Vomiting. Female Genitourinary Not Present- Frequency, Nocturia, Painful Urination, Pelvic Pain and Urgency. Musculoskeletal Present- Back Pain and Joint Pain. Not Present- Joint Stiffness, Muscle Pain, Muscle Weakness and Swelling  of Extremities. Neurological Present- Headaches, Numbness, Tingling and Weakness. Not Present- Decreased Memory, Fainting, Seizures, Tremor and Trouble walking. Psychiatric Present- Anxiety, Bipolar, Change in Sleep Pattern, Depression and  Frequent crying. Not Present- Fearful. Endocrine Not Present- Cold Intolerance, Excessive Hunger, Hair Changes, Heat Intolerance, Hot flashes and New Diabetes. Hematology Not Present- Blood Thinners, Easy Bruising, Excessive bleeding, Gland problems, HIV and Persistent Infections.  Vitals  Weight: 210.4 lb Height: 68in Body Surface Area: 2.09 m Body Mass Index: 31.99 kg/m  Temp.: 97.8F  Pulse: 97 (Regular)  BP: 132/78 (Sitting, Left Arm, Standard)       Physical Exam  General Mental Status-Alert. General Appearance-Consistent with stated age. Hydration-Well hydrated. Voice-Normal.  Head and Neck Head-normocephalic, atraumatic with no lesions or palpable masses. Trachea-midline. Thyroid Gland Characteristics - normal size and consistency.  Eye Eyeball - Bilateral-Extraocular movements intact. Sclera/Conjunctiva - Bilateral-No scleral icterus.  Chest and Lung Exam Chest and lung exam reveals -quiet, even and easy respiratory effort with no use of accessory muscles and on auscultation, normal breath sounds, no adventitious sounds and normal vocal resonance. Inspection Chest Wall - Normal. Back - normal.  Breast Note: There is some vague palpable fullness in the upper outer right breast. Other than this there is no other palpable mass in either breast. There is no palpable axillary, supraclavicular, or cervical lymphadenopathy.   Cardiovascular Cardiovascular examination reveals -normal heart sounds, regular rate and rhythm with no murmurs and normal pedal pulses bilaterally.  Abdomen Inspection Inspection of the abdomen reveals - No Hernias. Skin - Scar - no surgical  scars. Palpation/Percussion Palpation and Percussion of the abdomen reveal - Soft, Non Tender, No Rebound tenderness, No Rigidity (guarding) and No hepatosplenomegaly. Auscultation Auscultation of the abdomen reveals - Bowel sounds normal.  Neurologic Neurologic evaluation reveals -alert and oriented x 3 with no impairment of recent or remote memory. Mental Status-Normal.  Musculoskeletal Normal Exam - Left-Upper Extremity Strength Normal and Lower Extremity Strength Normal. Normal Exam - Right-Upper Extremity Strength Normal and Lower Extremity Strength Normal.  Lymphatic Head & Neck  General Head & Neck Lymphatics: Bilateral - Description - Normal. Axillary  General Axillary Region: Bilateral - Description - Normal. Tenderness - Non Tender. Femoral & Inguinal  Generalized Femoral & Inguinal Lymphatics: Bilateral - Description - Normal. Tenderness - Non Tender.    Assessment & Plan  SCLEROSING ADENOSIS OF BREAST, RIGHT (N60.21) Impression: The patient appears to have a complex sclerosing lesion in the upper outer right breast. Because of its abnormal appearance and because it measured 1.7 cm I think it would be very reasonable to remove this area to make sure that we are not missing anything. I have discussed with her in detail the risks and benefits of the operation as well as some of the technical aspects and she understands and wishes to proceed. I will plan for a right breast radioactive seed localized lumpectomy.

## 2018-03-14 NOTE — Op Note (Signed)
03/14/2018  10:17 AM  PATIENT:  Bailey Russell  52 y.o. female  PRE-OPERATIVE DIAGNOSIS:  right breast complex sclerosing lesion  POST-OPERATIVE DIAGNOSIS:  right breast complex sclerosing lesion  PROCEDURE:  Procedure(s): BREAST LUMPECTOMY WITH RADIOACTIVE SEED LOCALIZATION (Right)  SURGEON:  Surgeon(s) and Role:    Jovita Kussmaul, MD - Primary  PHYSICIAN ASSISTANT:   ASSISTANTS: none   ANESTHESIA:   local and general  EBL:  minimal   BLOOD ADMINISTERED:none  DRAINS: none   LOCAL MEDICATIONS USED:  MARCAINE     SPECIMEN:  Source of Specimen:  right breast tissue  DISPOSITION OF SPECIMEN:  PATHOLOGY  COUNTS:  YES  TOURNIQUET:  * No tourniquets in log *  DICTATION: .Dragon Dictation   After informed consent was obtained the patient was brought to the operating room and placed in the supine position on the operating table.  After adequate induction of general anesthesia the patient's right breast was prepped with ChloraPrep, allowed to dry, and draped in usual sterile manner.  An appropriate timeout was performed.  Previously an I-125 seed was placed in the upper outer quadrant of the right breast to mark an area of a complex sclerosing lesion.  The neoprobe was set to I-125 in the area of radioactivity was readily identified.  The area around this was infiltrated with quarter percent Marcaine.  A curvilinear incision was made along the upper outer edge of the areole of the right breast with a 15 blade knife.  The incision was carried through the skin and subcutaneous tissue sharply with electrocautery.  The dissection was then carried towards the radioactive seed under the direction of the neoprobe.  Once I more closely approached the radioactive seed I then removed a circular portion of breast tissue sharply with the electrocautery around the radioactive seed while checking the area of radioactivity frequently with the neoprobe.  Once the specimen was removed it was  oriented with the appropriate paint colors.  A specimen radiograph was obtained that showed the clip and seed to be near the center of the specimen.  The specimen was then sent to pathology for further evaluation.  Hemostasis was achieved using the Bovie electrocautery.  The wound was irrigated with saline and infiltrated with quarter percent Marcaine.  The deep layer of the wound was then closed with layers of interrupted 3-0 Vicryl stitches.  The skin was then closed with interrupted 4-0 Monocryl subcuticular stitches.  Dermabond dressings were applied.  The patient tolerated the procedure well.  At the end of the case all needle sponge and instrument counts were correct.  The patient was then awakened and taken to recovery in stable condition.  PLAN OF CARE: Discharge to home after PACU  PATIENT DISPOSITION:  PACU - hemodynamically stable.   Delay start of Pharmacological VTE agent (>24hrs) due to surgical blood loss or risk of bleeding: not applicable

## 2018-03-15 ENCOUNTER — Encounter (HOSPITAL_BASED_OUTPATIENT_CLINIC_OR_DEPARTMENT_OTHER): Payer: Self-pay | Admitting: General Surgery

## 2018-03-17 NOTE — Anesthesia Postprocedure Evaluation (Signed)
Anesthesia Post Note  Patient: Bailey Russell  Procedure(s) Performed: BREAST LUMPECTOMY WITH RADIOACTIVE SEED LOCALIZATION (Right Breast)     Patient location during evaluation: PACU Anesthesia Type: General Level of consciousness: awake Pain management: pain level controlled Vital Signs Assessment: post-procedure vital signs reviewed and stable Respiratory status: spontaneous breathing Cardiovascular status: stable Postop Assessment: no apparent nausea or vomiting Anesthetic complications: no    Last Vitals:  Vitals:   03/14/18 1100 03/14/18 1140  BP: (!) 166/82 (!) 145/86  Pulse: 68 65  Resp: 13 14  Temp:  36.8 C  SpO2: 100% 100%    Last Pain:  Vitals:   03/15/18 0819  TempSrc:   PainSc: 2    Pain Goal:                 Keyanni Whittinghill JR,JOHN Lanay Zinda

## 2018-03-25 DIAGNOSIS — J341 Cyst and mucocele of nose and nasal sinus: Secondary | ICD-10-CM | POA: Diagnosis not present

## 2018-03-25 DIAGNOSIS — J301 Allergic rhinitis due to pollen: Secondary | ICD-10-CM | POA: Diagnosis not present

## 2018-03-25 DIAGNOSIS — R05 Cough: Secondary | ICD-10-CM | POA: Diagnosis not present

## 2018-04-10 DIAGNOSIS — J320 Chronic maxillary sinusitis: Secondary | ICD-10-CM | POA: Diagnosis not present

## 2018-04-10 DIAGNOSIS — J342 Deviated nasal septum: Secondary | ICD-10-CM | POA: Diagnosis not present

## 2018-04-10 DIAGNOSIS — J343 Hypertrophy of nasal turbinates: Secondary | ICD-10-CM | POA: Diagnosis not present

## 2018-04-15 DIAGNOSIS — M48062 Spinal stenosis, lumbar region with neurogenic claudication: Secondary | ICD-10-CM | POA: Diagnosis not present

## 2018-04-15 DIAGNOSIS — M5416 Radiculopathy, lumbar region: Secondary | ICD-10-CM | POA: Diagnosis not present

## 2018-04-15 DIAGNOSIS — M47816 Spondylosis without myelopathy or radiculopathy, lumbar region: Secondary | ICD-10-CM | POA: Diagnosis not present

## 2018-04-16 DIAGNOSIS — E669 Obesity, unspecified: Secondary | ICD-10-CM | POA: Diagnosis not present

## 2018-04-16 DIAGNOSIS — Z720 Tobacco use: Secondary | ICD-10-CM | POA: Diagnosis not present

## 2018-04-16 DIAGNOSIS — I10 Essential (primary) hypertension: Secondary | ICD-10-CM | POA: Diagnosis not present

## 2018-04-16 DIAGNOSIS — G43009 Migraine without aura, not intractable, without status migrainosus: Secondary | ICD-10-CM | POA: Diagnosis not present

## 2018-04-16 DIAGNOSIS — J45909 Unspecified asthma, uncomplicated: Secondary | ICD-10-CM | POA: Diagnosis not present

## 2018-04-17 DIAGNOSIS — J329 Chronic sinusitis, unspecified: Secondary | ICD-10-CM | POA: Diagnosis not present

## 2018-05-02 DIAGNOSIS — E669 Obesity, unspecified: Secondary | ICD-10-CM | POA: Diagnosis not present

## 2018-05-02 DIAGNOSIS — I10 Essential (primary) hypertension: Secondary | ICD-10-CM | POA: Diagnosis not present

## 2018-05-02 DIAGNOSIS — Z720 Tobacco use: Secondary | ICD-10-CM | POA: Diagnosis not present

## 2018-05-02 DIAGNOSIS — J45909 Unspecified asthma, uncomplicated: Secondary | ICD-10-CM | POA: Diagnosis not present

## 2018-05-02 DIAGNOSIS — G43009 Migraine without aura, not intractable, without status migrainosus: Secondary | ICD-10-CM | POA: Diagnosis not present

## 2018-05-03 DIAGNOSIS — M48062 Spinal stenosis, lumbar region with neurogenic claudication: Secondary | ICD-10-CM | POA: Diagnosis not present

## 2018-05-03 DIAGNOSIS — R200 Anesthesia of skin: Secondary | ICD-10-CM | POA: Diagnosis not present

## 2018-05-08 DIAGNOSIS — M47816 Spondylosis without myelopathy or radiculopathy, lumbar region: Secondary | ICD-10-CM | POA: Diagnosis not present

## 2018-05-09 DIAGNOSIS — J329 Chronic sinusitis, unspecified: Secondary | ICD-10-CM | POA: Diagnosis not present

## 2018-05-16 DIAGNOSIS — I10 Essential (primary) hypertension: Secondary | ICD-10-CM | POA: Diagnosis not present

## 2018-05-16 DIAGNOSIS — F319 Bipolar disorder, unspecified: Secondary | ICD-10-CM | POA: Diagnosis not present

## 2018-05-16 DIAGNOSIS — M48062 Spinal stenosis, lumbar region with neurogenic claudication: Secondary | ICD-10-CM | POA: Diagnosis not present

## 2018-05-16 DIAGNOSIS — Z6834 Body mass index (BMI) 34.0-34.9, adult: Secondary | ICD-10-CM | POA: Diagnosis not present

## 2018-05-16 DIAGNOSIS — M47816 Spondylosis without myelopathy or radiculopathy, lumbar region: Secondary | ICD-10-CM | POA: Diagnosis not present

## 2018-05-16 DIAGNOSIS — R200 Anesthesia of skin: Secondary | ICD-10-CM | POA: Diagnosis not present

## 2018-05-16 DIAGNOSIS — M5416 Radiculopathy, lumbar region: Secondary | ICD-10-CM | POA: Diagnosis not present

## 2018-07-09 DIAGNOSIS — I10 Essential (primary) hypertension: Secondary | ICD-10-CM | POA: Diagnosis not present

## 2018-07-09 DIAGNOSIS — J45909 Unspecified asthma, uncomplicated: Secondary | ICD-10-CM | POA: Diagnosis not present

## 2018-07-09 DIAGNOSIS — E669 Obesity, unspecified: Secondary | ICD-10-CM | POA: Diagnosis not present

## 2018-07-09 DIAGNOSIS — Z720 Tobacco use: Secondary | ICD-10-CM | POA: Diagnosis not present

## 2018-07-09 DIAGNOSIS — Z2821 Immunization not carried out because of patient refusal: Secondary | ICD-10-CM | POA: Diagnosis not present

## 2018-07-09 DIAGNOSIS — G43009 Migraine without aura, not intractable, without status migrainosus: Secondary | ICD-10-CM | POA: Diagnosis not present

## 2018-09-18 DIAGNOSIS — G43009 Migraine without aura, not intractable, without status migrainosus: Secondary | ICD-10-CM | POA: Diagnosis not present

## 2018-09-18 DIAGNOSIS — I10 Essential (primary) hypertension: Secondary | ICD-10-CM | POA: Diagnosis not present

## 2018-09-18 DIAGNOSIS — Z720 Tobacco use: Secondary | ICD-10-CM | POA: Diagnosis not present

## 2018-09-18 DIAGNOSIS — J45909 Unspecified asthma, uncomplicated: Secondary | ICD-10-CM | POA: Diagnosis not present

## 2018-09-18 DIAGNOSIS — E669 Obesity, unspecified: Secondary | ICD-10-CM | POA: Diagnosis not present

## 2018-09-18 DIAGNOSIS — R42 Dizziness and giddiness: Secondary | ICD-10-CM | POA: Diagnosis not present

## 2018-09-24 DIAGNOSIS — R51 Headache: Secondary | ICD-10-CM | POA: Diagnosis not present

## 2018-09-24 DIAGNOSIS — J329 Chronic sinusitis, unspecified: Secondary | ICD-10-CM | POA: Diagnosis not present

## 2018-10-15 DIAGNOSIS — F419 Anxiety disorder, unspecified: Secondary | ICD-10-CM | POA: Diagnosis not present

## 2018-10-15 DIAGNOSIS — R55 Syncope and collapse: Secondary | ICD-10-CM | POA: Diagnosis not present

## 2018-10-16 DIAGNOSIS — I10 Essential (primary) hypertension: Secondary | ICD-10-CM | POA: Diagnosis not present

## 2018-10-16 DIAGNOSIS — R42 Dizziness and giddiness: Secondary | ICD-10-CM | POA: Diagnosis not present

## 2018-10-16 DIAGNOSIS — E669 Obesity, unspecified: Secondary | ICD-10-CM | POA: Diagnosis not present

## 2018-10-16 DIAGNOSIS — J45909 Unspecified asthma, uncomplicated: Secondary | ICD-10-CM | POA: Diagnosis not present

## 2018-10-16 DIAGNOSIS — G43009 Migraine without aura, not intractable, without status migrainosus: Secondary | ICD-10-CM | POA: Diagnosis not present

## 2018-10-16 DIAGNOSIS — Z720 Tobacco use: Secondary | ICD-10-CM | POA: Diagnosis not present

## 2018-11-21 DIAGNOSIS — H538 Other visual disturbances: Secondary | ICD-10-CM | POA: Diagnosis not present

## 2018-11-21 DIAGNOSIS — R42 Dizziness and giddiness: Secondary | ICD-10-CM | POA: Diagnosis not present

## 2018-11-21 DIAGNOSIS — H5789 Other specified disorders of eye and adnexa: Secondary | ICD-10-CM | POA: Diagnosis not present

## 2018-11-21 DIAGNOSIS — R51 Headache: Secondary | ICD-10-CM | POA: Diagnosis not present

## 2018-11-22 DIAGNOSIS — G43909 Migraine, unspecified, not intractable, without status migrainosus: Secondary | ICD-10-CM | POA: Diagnosis not present

## 2018-11-22 DIAGNOSIS — M47816 Spondylosis without myelopathy or radiculopathy, lumbar region: Secondary | ICD-10-CM | POA: Diagnosis not present

## 2018-11-22 DIAGNOSIS — M25569 Pain in unspecified knee: Secondary | ICD-10-CM | POA: Diagnosis not present

## 2018-11-22 DIAGNOSIS — G894 Chronic pain syndrome: Secondary | ICD-10-CM | POA: Diagnosis not present

## 2018-11-22 DIAGNOSIS — Z79899 Other long term (current) drug therapy: Secondary | ICD-10-CM | POA: Diagnosis not present

## 2018-11-22 DIAGNOSIS — Z79891 Long term (current) use of opiate analgesic: Secondary | ICD-10-CM | POA: Diagnosis not present

## 2018-12-02 DIAGNOSIS — M47817 Spondylosis without myelopathy or radiculopathy, lumbosacral region: Secondary | ICD-10-CM | POA: Diagnosis not present

## 2018-12-17 DIAGNOSIS — Z6837 Body mass index (BMI) 37.0-37.9, adult: Secondary | ICD-10-CM | POA: Diagnosis not present

## 2018-12-17 DIAGNOSIS — G43709 Chronic migraine without aura, not intractable, without status migrainosus: Secondary | ICD-10-CM | POA: Diagnosis not present

## 2018-12-17 DIAGNOSIS — F17200 Nicotine dependence, unspecified, uncomplicated: Secondary | ICD-10-CM | POA: Diagnosis not present

## 2018-12-17 DIAGNOSIS — F319 Bipolar disorder, unspecified: Secondary | ICD-10-CM | POA: Diagnosis not present

## 2018-12-17 DIAGNOSIS — G43009 Migraine without aura, not intractable, without status migrainosus: Secondary | ICD-10-CM | POA: Diagnosis not present

## 2018-12-25 DIAGNOSIS — G43709 Chronic migraine without aura, not intractable, without status migrainosus: Secondary | ICD-10-CM | POA: Diagnosis not present

## 2018-12-30 DIAGNOSIS — M47817 Spondylosis without myelopathy or radiculopathy, lumbosacral region: Secondary | ICD-10-CM | POA: Diagnosis not present

## 2019-01-13 DIAGNOSIS — M47817 Spondylosis without myelopathy or radiculopathy, lumbosacral region: Secondary | ICD-10-CM | POA: Diagnosis not present

## 2019-01-13 DIAGNOSIS — G894 Chronic pain syndrome: Secondary | ICD-10-CM | POA: Diagnosis not present

## 2019-01-13 DIAGNOSIS — M545 Low back pain: Secondary | ICD-10-CM | POA: Diagnosis not present

## 2019-01-13 DIAGNOSIS — M5136 Other intervertebral disc degeneration, lumbar region: Secondary | ICD-10-CM | POA: Diagnosis not present

## 2019-01-20 DIAGNOSIS — I10 Essential (primary) hypertension: Secondary | ICD-10-CM | POA: Diagnosis not present

## 2019-01-20 DIAGNOSIS — Z720 Tobacco use: Secondary | ICD-10-CM | POA: Diagnosis not present

## 2019-01-20 DIAGNOSIS — G43009 Migraine without aura, not intractable, without status migrainosus: Secondary | ICD-10-CM | POA: Diagnosis not present

## 2019-01-20 DIAGNOSIS — E669 Obesity, unspecified: Secondary | ICD-10-CM | POA: Diagnosis not present

## 2019-01-20 DIAGNOSIS — Z0000 Encounter for general adult medical examination without abnormal findings: Secondary | ICD-10-CM | POA: Diagnosis not present

## 2019-01-20 DIAGNOSIS — J45909 Unspecified asthma, uncomplicated: Secondary | ICD-10-CM | POA: Diagnosis not present

## 2019-01-20 DIAGNOSIS — G44219 Episodic tension-type headache, not intractable: Secondary | ICD-10-CM | POA: Diagnosis not present

## 2019-02-11 DIAGNOSIS — E860 Dehydration: Secondary | ICD-10-CM | POA: Diagnosis not present

## 2019-02-11 DIAGNOSIS — G43909 Migraine, unspecified, not intractable, without status migrainosus: Secondary | ICD-10-CM | POA: Diagnosis not present

## 2019-02-11 DIAGNOSIS — I252 Old myocardial infarction: Secondary | ICD-10-CM | POA: Diagnosis not present

## 2019-02-11 DIAGNOSIS — F319 Bipolar disorder, unspecified: Secondary | ICD-10-CM | POA: Diagnosis not present

## 2019-02-11 DIAGNOSIS — Z8673 Personal history of transient ischemic attack (TIA), and cerebral infarction without residual deficits: Secondary | ICD-10-CM | POA: Diagnosis not present

## 2019-02-11 DIAGNOSIS — R51 Headache: Secondary | ICD-10-CM | POA: Diagnosis not present

## 2019-02-11 DIAGNOSIS — R001 Bradycardia, unspecified: Secondary | ICD-10-CM | POA: Diagnosis not present

## 2019-02-11 DIAGNOSIS — Z79899 Other long term (current) drug therapy: Secondary | ICD-10-CM | POA: Diagnosis not present

## 2019-02-11 DIAGNOSIS — R55 Syncope and collapse: Secondary | ICD-10-CM | POA: Diagnosis not present

## 2019-02-11 DIAGNOSIS — Z886 Allergy status to analgesic agent status: Secondary | ICD-10-CM | POA: Diagnosis not present

## 2019-02-11 DIAGNOSIS — I10 Essential (primary) hypertension: Secondary | ICD-10-CM | POA: Diagnosis not present

## 2019-02-11 DIAGNOSIS — F17210 Nicotine dependence, cigarettes, uncomplicated: Secondary | ICD-10-CM | POA: Diagnosis not present

## 2019-02-11 DIAGNOSIS — Z888 Allergy status to other drugs, medicaments and biological substances status: Secondary | ICD-10-CM | POA: Diagnosis not present

## 2019-02-25 DIAGNOSIS — F331 Major depressive disorder, recurrent, moderate: Secondary | ICD-10-CM | POA: Diagnosis not present

## 2019-02-25 DIAGNOSIS — G43719 Chronic migraine without aura, intractable, without status migrainosus: Secondary | ICD-10-CM | POA: Diagnosis not present

## 2019-02-25 DIAGNOSIS — R55 Syncope and collapse: Secondary | ICD-10-CM | POA: Diagnosis not present

## 2019-02-25 DIAGNOSIS — F419 Anxiety disorder, unspecified: Secondary | ICD-10-CM | POA: Diagnosis not present

## 2019-02-28 DIAGNOSIS — F331 Major depressive disorder, recurrent, moderate: Secondary | ICD-10-CM | POA: Diagnosis not present

## 2019-02-28 DIAGNOSIS — G43719 Chronic migraine without aura, intractable, without status migrainosus: Secondary | ICD-10-CM | POA: Diagnosis not present

## 2019-02-28 DIAGNOSIS — F419 Anxiety disorder, unspecified: Secondary | ICD-10-CM | POA: Diagnosis not present

## 2019-02-28 DIAGNOSIS — R55 Syncope and collapse: Secondary | ICD-10-CM | POA: Diagnosis not present

## 2019-03-03 DIAGNOSIS — I10 Essential (primary) hypertension: Secondary | ICD-10-CM | POA: Diagnosis not present

## 2019-03-03 DIAGNOSIS — Z720 Tobacco use: Secondary | ICD-10-CM | POA: Diagnosis not present

## 2019-03-03 DIAGNOSIS — J45909 Unspecified asthma, uncomplicated: Secondary | ICD-10-CM | POA: Diagnosis not present

## 2019-03-03 DIAGNOSIS — E669 Obesity, unspecified: Secondary | ICD-10-CM | POA: Diagnosis not present

## 2019-03-03 DIAGNOSIS — G43009 Migraine without aura, not intractable, without status migrainosus: Secondary | ICD-10-CM | POA: Diagnosis not present

## 2019-03-20 DIAGNOSIS — R55 Syncope and collapse: Secondary | ICD-10-CM | POA: Diagnosis not present

## 2019-03-20 DIAGNOSIS — F331 Major depressive disorder, recurrent, moderate: Secondary | ICD-10-CM | POA: Diagnosis not present

## 2019-03-20 DIAGNOSIS — F419 Anxiety disorder, unspecified: Secondary | ICD-10-CM | POA: Diagnosis not present

## 2019-03-20 DIAGNOSIS — G43719 Chronic migraine without aura, intractable, without status migrainosus: Secondary | ICD-10-CM | POA: Diagnosis not present

## 2019-03-26 DIAGNOSIS — G43709 Chronic migraine without aura, not intractable, without status migrainosus: Secondary | ICD-10-CM | POA: Diagnosis not present

## 2019-03-31 DIAGNOSIS — I10 Essential (primary) hypertension: Secondary | ICD-10-CM | POA: Diagnosis not present

## 2019-03-31 DIAGNOSIS — G43009 Migraine without aura, not intractable, without status migrainosus: Secondary | ICD-10-CM | POA: Diagnosis not present

## 2019-03-31 DIAGNOSIS — E669 Obesity, unspecified: Secondary | ICD-10-CM | POA: Diagnosis not present

## 2019-03-31 DIAGNOSIS — J45909 Unspecified asthma, uncomplicated: Secondary | ICD-10-CM | POA: Diagnosis not present

## 2019-03-31 DIAGNOSIS — Z720 Tobacco use: Secondary | ICD-10-CM | POA: Diagnosis not present

## 2019-05-20 DIAGNOSIS — F314 Bipolar disorder, current episode depressed, severe, without psychotic features: Secondary | ICD-10-CM | POA: Diagnosis not present

## 2019-05-20 DIAGNOSIS — F4310 Post-traumatic stress disorder, unspecified: Secondary | ICD-10-CM | POA: Diagnosis not present

## 2019-05-20 DIAGNOSIS — Z720 Tobacco use: Secondary | ICD-10-CM | POA: Diagnosis not present

## 2019-07-09 DIAGNOSIS — G43709 Chronic migraine without aura, not intractable, without status migrainosus: Secondary | ICD-10-CM | POA: Diagnosis not present

## 2019-07-11 DIAGNOSIS — F314 Bipolar disorder, current episode depressed, severe, without psychotic features: Secondary | ICD-10-CM | POA: Diagnosis not present

## 2019-07-11 DIAGNOSIS — F419 Anxiety disorder, unspecified: Secondary | ICD-10-CM | POA: Diagnosis not present

## 2019-07-11 DIAGNOSIS — F4310 Post-traumatic stress disorder, unspecified: Secondary | ICD-10-CM | POA: Diagnosis not present

## 2019-07-19 DIAGNOSIS — M5416 Radiculopathy, lumbar region: Secondary | ICD-10-CM | POA: Diagnosis not present

## 2019-07-19 DIAGNOSIS — M25562 Pain in left knee: Secondary | ICD-10-CM | POA: Diagnosis not present

## 2019-07-19 DIAGNOSIS — M79605 Pain in left leg: Secondary | ICD-10-CM | POA: Diagnosis not present

## 2019-07-19 DIAGNOSIS — I10 Essential (primary) hypertension: Secondary | ICD-10-CM | POA: Diagnosis not present

## 2019-07-19 DIAGNOSIS — I252 Old myocardial infarction: Secondary | ICD-10-CM | POA: Diagnosis not present

## 2019-07-19 DIAGNOSIS — M1990 Unspecified osteoarthritis, unspecified site: Secondary | ICD-10-CM | POA: Diagnosis not present

## 2019-07-19 DIAGNOSIS — Z79899 Other long term (current) drug therapy: Secondary | ICD-10-CM | POA: Diagnosis not present

## 2019-07-19 DIAGNOSIS — Z8673 Personal history of transient ischemic attack (TIA), and cerebral infarction without residual deficits: Secondary | ICD-10-CM | POA: Diagnosis not present

## 2019-07-19 DIAGNOSIS — F17210 Nicotine dependence, cigarettes, uncomplicated: Secondary | ICD-10-CM | POA: Diagnosis not present

## 2019-07-19 DIAGNOSIS — F319 Bipolar disorder, unspecified: Secondary | ICD-10-CM | POA: Diagnosis not present

## 2019-08-06 DIAGNOSIS — M545 Low back pain: Secondary | ICD-10-CM | POA: Diagnosis not present

## 2019-08-06 DIAGNOSIS — M25562 Pain in left knee: Secondary | ICD-10-CM | POA: Diagnosis not present

## 2019-08-06 DIAGNOSIS — M25572 Pain in left ankle and joints of left foot: Secondary | ICD-10-CM | POA: Diagnosis not present

## 2019-08-11 DIAGNOSIS — M25562 Pain in left knee: Secondary | ICD-10-CM | POA: Diagnosis not present

## 2019-08-13 DIAGNOSIS — M25562 Pain in left knee: Secondary | ICD-10-CM | POA: Diagnosis not present

## 2019-08-14 DIAGNOSIS — M25562 Pain in left knee: Secondary | ICD-10-CM | POA: Diagnosis not present

## 2019-09-09 DIAGNOSIS — Z86018 Personal history of other benign neoplasm: Secondary | ICD-10-CM | POA: Diagnosis not present

## 2019-09-09 DIAGNOSIS — N911 Secondary amenorrhea: Secondary | ICD-10-CM | POA: Diagnosis not present

## 2019-09-09 DIAGNOSIS — Z124 Encounter for screening for malignant neoplasm of cervix: Secondary | ICD-10-CM | POA: Diagnosis not present

## 2019-09-09 DIAGNOSIS — N852 Hypertrophy of uterus: Secondary | ICD-10-CM | POA: Diagnosis not present

## 2019-09-09 DIAGNOSIS — Z113 Encounter for screening for infections with a predominantly sexual mode of transmission: Secondary | ICD-10-CM | POA: Diagnosis not present

## 2019-09-09 DIAGNOSIS — Z01419 Encounter for gynecological examination (general) (routine) without abnormal findings: Secondary | ICD-10-CM | POA: Diagnosis not present

## 2019-09-12 DIAGNOSIS — Z86010 Personal history of colonic polyps: Secondary | ICD-10-CM | POA: Diagnosis not present

## 2019-09-12 DIAGNOSIS — K5900 Constipation, unspecified: Secondary | ICD-10-CM | POA: Diagnosis not present

## 2019-09-12 DIAGNOSIS — R1310 Dysphagia, unspecified: Secondary | ICD-10-CM | POA: Diagnosis not present

## 2019-09-15 DIAGNOSIS — M62512 Muscle wasting and atrophy, not elsewhere classified, left shoulder: Secondary | ICD-10-CM | POA: Diagnosis not present

## 2019-09-15 DIAGNOSIS — M25662 Stiffness of left knee, not elsewhere classified: Secondary | ICD-10-CM | POA: Diagnosis not present

## 2019-09-15 DIAGNOSIS — M25462 Effusion, left knee: Secondary | ICD-10-CM | POA: Diagnosis not present

## 2019-09-15 DIAGNOSIS — M25562 Pain in left knee: Secondary | ICD-10-CM | POA: Diagnosis not present

## 2019-09-17 DIAGNOSIS — M25662 Stiffness of left knee, not elsewhere classified: Secondary | ICD-10-CM | POA: Diagnosis not present

## 2019-09-17 DIAGNOSIS — M25462 Effusion, left knee: Secondary | ICD-10-CM | POA: Diagnosis not present

## 2019-09-17 DIAGNOSIS — M25562 Pain in left knee: Secondary | ICD-10-CM | POA: Diagnosis not present

## 2019-09-17 DIAGNOSIS — M62512 Muscle wasting and atrophy, not elsewhere classified, left shoulder: Secondary | ICD-10-CM | POA: Diagnosis not present

## 2019-09-19 DIAGNOSIS — Z1159 Encounter for screening for other viral diseases: Secondary | ICD-10-CM | POA: Diagnosis not present

## 2019-09-22 DIAGNOSIS — M62512 Muscle wasting and atrophy, not elsewhere classified, left shoulder: Secondary | ICD-10-CM | POA: Diagnosis not present

## 2019-09-22 DIAGNOSIS — M25562 Pain in left knee: Secondary | ICD-10-CM | POA: Diagnosis not present

## 2019-09-22 DIAGNOSIS — M25462 Effusion, left knee: Secondary | ICD-10-CM | POA: Diagnosis not present

## 2019-09-22 DIAGNOSIS — M25662 Stiffness of left knee, not elsewhere classified: Secondary | ICD-10-CM | POA: Diagnosis not present

## 2019-09-23 DIAGNOSIS — M62512 Muscle wasting and atrophy, not elsewhere classified, left shoulder: Secondary | ICD-10-CM | POA: Diagnosis not present

## 2019-09-23 DIAGNOSIS — M25462 Effusion, left knee: Secondary | ICD-10-CM | POA: Diagnosis not present

## 2019-09-23 DIAGNOSIS — M25562 Pain in left knee: Secondary | ICD-10-CM | POA: Diagnosis not present

## 2019-09-23 DIAGNOSIS — M25662 Stiffness of left knee, not elsewhere classified: Secondary | ICD-10-CM | POA: Diagnosis not present

## 2019-09-24 DIAGNOSIS — D123 Benign neoplasm of transverse colon: Secondary | ICD-10-CM | POA: Diagnosis not present

## 2019-09-24 DIAGNOSIS — K222 Esophageal obstruction: Secondary | ICD-10-CM | POA: Diagnosis not present

## 2019-09-24 DIAGNOSIS — K5730 Diverticulosis of large intestine without perforation or abscess without bleeding: Secondary | ICD-10-CM | POA: Diagnosis not present

## 2019-09-24 DIAGNOSIS — R1310 Dysphagia, unspecified: Secondary | ICD-10-CM | POA: Diagnosis not present

## 2019-09-24 DIAGNOSIS — Z86010 Personal history of colonic polyps: Secondary | ICD-10-CM | POA: Diagnosis not present

## 2019-09-24 DIAGNOSIS — K5900 Constipation, unspecified: Secondary | ICD-10-CM | POA: Diagnosis not present

## 2019-09-24 DIAGNOSIS — D125 Benign neoplasm of sigmoid colon: Secondary | ICD-10-CM | POA: Diagnosis not present

## 2019-09-26 DIAGNOSIS — M25562 Pain in left knee: Secondary | ICD-10-CM | POA: Diagnosis not present

## 2019-09-26 DIAGNOSIS — D123 Benign neoplasm of transverse colon: Secondary | ICD-10-CM | POA: Diagnosis not present

## 2019-09-26 DIAGNOSIS — M25462 Effusion, left knee: Secondary | ICD-10-CM | POA: Diagnosis not present

## 2019-09-26 DIAGNOSIS — M25662 Stiffness of left knee, not elsewhere classified: Secondary | ICD-10-CM | POA: Diagnosis not present

## 2019-09-26 DIAGNOSIS — M62512 Muscle wasting and atrophy, not elsewhere classified, left shoulder: Secondary | ICD-10-CM | POA: Diagnosis not present

## 2019-09-26 DIAGNOSIS — D125 Benign neoplasm of sigmoid colon: Secondary | ICD-10-CM | POA: Diagnosis not present

## 2019-09-29 DIAGNOSIS — M25662 Stiffness of left knee, not elsewhere classified: Secondary | ICD-10-CM | POA: Diagnosis not present

## 2019-09-29 DIAGNOSIS — M25562 Pain in left knee: Secondary | ICD-10-CM | POA: Diagnosis not present

## 2019-09-29 DIAGNOSIS — M25462 Effusion, left knee: Secondary | ICD-10-CM | POA: Diagnosis not present

## 2019-09-29 DIAGNOSIS — M62512 Muscle wasting and atrophy, not elsewhere classified, left shoulder: Secondary | ICD-10-CM | POA: Diagnosis not present

## 2019-10-03 DIAGNOSIS — F4310 Post-traumatic stress disorder, unspecified: Secondary | ICD-10-CM | POA: Diagnosis not present

## 2019-10-03 DIAGNOSIS — F419 Anxiety disorder, unspecified: Secondary | ICD-10-CM | POA: Diagnosis not present

## 2019-10-03 DIAGNOSIS — F314 Bipolar disorder, current episode depressed, severe, without psychotic features: Secondary | ICD-10-CM | POA: Diagnosis not present

## 2019-10-13 DIAGNOSIS — M5432 Sciatica, left side: Secondary | ICD-10-CM | POA: Diagnosis not present

## 2019-10-13 DIAGNOSIS — J45909 Unspecified asthma, uncomplicated: Secondary | ICD-10-CM | POA: Diagnosis not present

## 2019-10-13 DIAGNOSIS — Z720 Tobacco use: Secondary | ICD-10-CM | POA: Diagnosis not present

## 2019-10-13 DIAGNOSIS — I10 Essential (primary) hypertension: Secondary | ICD-10-CM | POA: Diagnosis not present

## 2019-10-13 DIAGNOSIS — G43009 Migraine without aura, not intractable, without status migrainosus: Secondary | ICD-10-CM | POA: Diagnosis not present

## 2019-10-13 DIAGNOSIS — E669 Obesity, unspecified: Secondary | ICD-10-CM | POA: Diagnosis not present

## 2019-10-13 DIAGNOSIS — M25562 Pain in left knee: Secondary | ICD-10-CM | POA: Diagnosis not present

## 2019-10-15 DIAGNOSIS — G43709 Chronic migraine without aura, not intractable, without status migrainosus: Secondary | ICD-10-CM | POA: Diagnosis not present

## 2019-11-21 DIAGNOSIS — N898 Other specified noninflammatory disorders of vagina: Secondary | ICD-10-CM | POA: Diagnosis not present

## 2019-11-21 DIAGNOSIS — Z113 Encounter for screening for infections with a predominantly sexual mode of transmission: Secondary | ICD-10-CM | POA: Diagnosis not present

## 2019-12-26 DIAGNOSIS — F419 Anxiety disorder, unspecified: Secondary | ICD-10-CM | POA: Diagnosis not present

## 2019-12-26 DIAGNOSIS — Z720 Tobacco use: Secondary | ICD-10-CM | POA: Diagnosis not present

## 2019-12-26 DIAGNOSIS — F314 Bipolar disorder, current episode depressed, severe, without psychotic features: Secondary | ICD-10-CM | POA: Diagnosis not present

## 2019-12-26 DIAGNOSIS — F4310 Post-traumatic stress disorder, unspecified: Secondary | ICD-10-CM | POA: Diagnosis not present

## 2020-01-17 DIAGNOSIS — Z23 Encounter for immunization: Secondary | ICD-10-CM | POA: Diagnosis not present

## 2020-02-03 DIAGNOSIS — Z136 Encounter for screening for cardiovascular disorders: Secondary | ICD-10-CM | POA: Diagnosis not present

## 2020-02-03 DIAGNOSIS — E669 Obesity, unspecified: Secondary | ICD-10-CM | POA: Diagnosis not present

## 2020-02-03 DIAGNOSIS — I119 Hypertensive heart disease without heart failure: Secondary | ICD-10-CM | POA: Diagnosis not present

## 2020-02-03 DIAGNOSIS — Z1329 Encounter for screening for other suspected endocrine disorder: Secondary | ICD-10-CM | POA: Diagnosis not present

## 2020-02-03 DIAGNOSIS — J4520 Mild intermittent asthma, uncomplicated: Secondary | ICD-10-CM | POA: Diagnosis not present

## 2020-02-03 DIAGNOSIS — G43009 Migraine without aura, not intractable, without status migrainosus: Secondary | ICD-10-CM | POA: Diagnosis not present

## 2020-02-03 DIAGNOSIS — Z131 Encounter for screening for diabetes mellitus: Secondary | ICD-10-CM | POA: Diagnosis not present

## 2020-02-03 DIAGNOSIS — Z0001 Encounter for general adult medical examination with abnormal findings: Secondary | ICD-10-CM | POA: Diagnosis not present

## 2020-02-03 DIAGNOSIS — I10 Essential (primary) hypertension: Secondary | ICD-10-CM | POA: Diagnosis not present

## 2020-02-03 DIAGNOSIS — M25561 Pain in right knee: Secondary | ICD-10-CM | POA: Diagnosis not present

## 2020-02-03 DIAGNOSIS — Z720 Tobacco use: Secondary | ICD-10-CM | POA: Diagnosis not present

## 2020-02-07 DIAGNOSIS — Z23 Encounter for immunization: Secondary | ICD-10-CM | POA: Diagnosis not present

## 2020-02-16 DIAGNOSIS — M25561 Pain in right knee: Secondary | ICD-10-CM | POA: Diagnosis not present

## 2020-02-17 DIAGNOSIS — M25561 Pain in right knee: Secondary | ICD-10-CM | POA: Diagnosis not present

## 2020-03-04 ENCOUNTER — Encounter: Payer: Self-pay | Admitting: Cardiology

## 2020-03-04 ENCOUNTER — Ambulatory Visit: Payer: Medicare Other | Admitting: Cardiology

## 2020-03-04 ENCOUNTER — Other Ambulatory Visit: Payer: Self-pay

## 2020-03-04 VITALS — BP 150/87 | HR 100 | Resp 16 | Ht 68.0 in | Wt 271.0 lb

## 2020-03-04 DIAGNOSIS — I10 Essential (primary) hypertension: Secondary | ICD-10-CM | POA: Diagnosis not present

## 2020-03-04 MED ORDER — LOSARTAN POTASSIUM-HCTZ 50-12.5 MG PO TABS: 1.0000 | ORAL_TABLET | Freq: Every day | ORAL | 2 refills | Status: DC

## 2020-03-04 NOTE — Progress Notes (Signed)
Patient referred by Benito Mccreedy, MD for hypertension  Subjective:   Bailey Russell, female    DOB: 1966/02/13, 54 y.o.   MRN: [id number]   Chief Complaint  Patient presents with   Hypertension   New Patient (Initial Visit)    HPI  54 y.o. African American female with hypertension  Patient has previously been on multiple medications, which were reportedly either not effective or not tolerated. Currently, she is not on any antihypertensive medications. She is trying to lose weight and walk regularly. She denies chest pain, shortness of breath, palpitations, leg edema, orthopnea, PND, TIA/syncope.                                  Past Medical History:  Diagnosis Date   Acid reflux    no issues since bypass   Anxiety    Arthritis    Depression    Ectopic pregnancy    Esophageal stricture    Headache(784.0)    migraines   Hiatal hernia    Hypertension    Inflammatory polyps of colon ([place])    Pneumonia    pneumonia last month not hospitalized   Sleep apnea    history of CPAP prior to gastric bypass, pt taken self off, over 1 year ago   Urinary tract infection      Past Surgical History:  Procedure Laterality Date   APPENDECTOMY     BREAST LUMPECTOMY WITH RADIOACTIVE SEED LOCALIZATION Right 03/14/2018   Procedure: BREAST LUMPECTOMY WITH RADIOACTIVE SEED LOCALIZATION;  Surgeon: Jovita Kussmaul, MD;  Location: [facility];  Service: General;  Laterality: Right;   CHOLECYSTECTOMY     ECTOPIC PREGNANCY SURGERY     GASTRIC BYPASS     february 2018   KNEE SURGERY     LAPAROSCOPY     X 2 to remove scar tissue   mass removed from R buttock     OVARIAN CYST REMOVAL     polypectomy     polyps removed from colon during colonoscopy   TONSILLECTOMY     WISDOM TOOTH EXTRACTION       Social History   Tobacco Use  Smoking Status Current Every Day Smoker   Packs/day: 0.50   Years: 1.00   Pack years: 0.50   Types:  Cigarettes  Smokeless Tobacco Never Used    Social History   Substance and Sexual Activity  Alcohol Use Yes   Comment: special events, wine or beer     Family History  Problem Relation Age of Onset   Hypertension Mother    Hypertension Sister    Hypertension Brother    Anesthesia problems Neg Hx    Diabetes Neg Hx    Heart attack Neg Hx    Hyperlipidemia Neg Hx    Sudden death Neg Hx      Current Outpatient Medications on File Prior to Visit  Medication Sig Dispense Refill   acetaminophen (TYLENOL) 500 MG tablet Take 1,000 mg by mouth every 6 (six) hours as needed.     amitriptyline (ELAVIL) 100 MG tablet Take 100 mg by mouth at bedtime.     baclofen (LIORESAL) 10 MG tablet Take 10 mg by mouth at bedtime as needed for muscle spasms.     bisoprolol-hydrochlorothiazide (ZIAC) 5-6.25 MG per tablet Take 1 tablet by mouth daily.     cariprazine (VRAYLAR) capsule Take by mouth daily.  cyclobenzaprine (FLEXERIL) 10 MG tablet Take 1 tablet (10 mg total) by mouth 2 (two) times daily as needed for muscle spasms. 20 tablet 0   diclofenac (VOLTAREN) 50 MG EC tablet Take 50 mg by mouth 2 (two) times daily.     diphenhydramine-acetaminophen (TYLENOL PM) 25-500 MG TABS tablet Take 2 tablets by mouth at bedtime as needed (take up to four for the onset of migraine).     diphenhydramine-acetaminophen (TYLENOL PM) 25-500 MG TABS Take 3-5 tablets by mouth as needed (for migraine).      doxepin (SINEQUAN) 50 MG capsule Take 50 mg by mouth at bedtime.     gabapentin (NEURONTIN) 600 MG tablet Take 600 mg by mouth 3 (three) times daily.     hydrochlorothiazide (HYDRODIURIL) 25 MG tablet Take 25 mg by mouth daily.     HYDROcodone-acetaminophen (NORCO/VICODIN) 5-325 MG tablet Take 1-2 tablets by mouth every 6 (six) hours as needed for moderate pain or severe pain. 15 tablet 0   ibuprofen (ADVIL,MOTRIN) 800 MG tablet Take 800 mg by mouth every 8 (eight) hours as needed.      predniSONE (DELTASONE) 20 MG tablet 3 tabs po day one, then 2 po daily x 4 days 11 tablet 0   topiramate (TOPAMAX) 200 MG tablet Take 200 mg by mouth at bedtime and may repeat dose one time if needed.     traMADol (ULTRAM) 50 MG tablet Take 1 tablet (50 mg total) by mouth every 6 (six) hours as needed. 15 tablet 0   Vitamin D, Ergocalciferol, (DRISDOL) 50000 units CAPS capsule Take 50,000 Units by mouth 2 (two) times daily.     No current facility-administered medications on file prior to visit.    Cardiovascular and other pertinent studies:  EKG 03/04/2020: Sinus tachycardia 111 bpm Poor R-wave progression   Recent labs: 02/03/2020: Glucose 87, BUN/Cr 11/0.95. EGFR 79. Na/K 143/4.4. Rest of the CMP normal H/H 10.3/33.2. MCV 76. Platelets 376 HbA1C 6.1%  Chol 191, TG 150, HDL 39, LDL 125 TSH 1.2 normal   Review of Systems  Cardiovascular: Negative for chest pain, dyspnea on exertion, leg swelling, palpitations and syncope.         Vitals:   03/04/20 1052  BP: (!) 151/97  Pulse: (!) 103  Resp: 16  SpO2: 96%     Body mass index is 41.21 kg/m. Filed Weights   03/04/20 1052  Weight: 271 lb (122.9 kg)     Objective:   Physical Exam Vitals and nursing note reviewed.  Constitutional:      General: She is not in acute distress. Neck:     Vascular: No JVD.  Cardiovascular:     Rate and Rhythm: Normal rate and regular rhythm.     Pulses: Intact distal pulses.     Heart sounds: Normal heart sounds. No murmur heard.   Pulmonary:     Effort: Pulmonary effort is normal.     Breath sounds: Normal breath sounds. No wheezing or rales.         Assessment & Recommendations:   54 y.o. African American female with hypertension  Hypertension: Recommend regular physical activity, salt restriction. Started losartan-HCTZ 50-12.5 mg. Check BMP in 2 weeks. Check echocardiogram.  Remote patient monitoring. If BP not improved, will add carvedilol.   Thank you for  referring the patient to [place]. Please feel free to contact with any questions.  Nigel Mormon, MD [facility] Pager: [phone] Office: [phone]

## 2020-03-09 ENCOUNTER — Ambulatory Visit: Payer: Medicare Other

## 2020-03-09 ENCOUNTER — Other Ambulatory Visit: Payer: Self-pay

## 2020-03-09 DIAGNOSIS — I10 Essential (primary) hypertension: Secondary | ICD-10-CM | POA: Diagnosis not present

## 2020-03-16 ENCOUNTER — Other Ambulatory Visit (HOSPITAL_COMMUNITY): Payer: Self-pay | Admitting: Cardiology

## 2020-03-16 DIAGNOSIS — M25561 Pain in right knee: Secondary | ICD-10-CM | POA: Diagnosis not present

## 2020-03-16 DIAGNOSIS — M25562 Pain in left knee: Secondary | ICD-10-CM | POA: Diagnosis not present

## 2020-03-16 DIAGNOSIS — I10 Essential (primary) hypertension: Secondary | ICD-10-CM | POA: Diagnosis not present

## 2020-03-17 LAB — BASIC METABOLIC PANEL
BUN/Creatinine Ratio: 9 (ref 9–23)
BUN: 9 mg/dL (ref 6–24)
CO2: 27 mmol/L (ref 20–29)
Calcium: 9.6 mg/dL (ref 8.7–10.2)
Chloride: 101 mmol/L (ref 96–106)
Creatinine, Ser: 1.03 mg/dL — ABNORMAL HIGH (ref 0.57–1.00)
GFR calc Af Amer: 72 mL/min/{1.73_m2} (ref >59)
GFR calc non Af Amer: 62 mL/min/{1.73_m2} (ref >59)
Glucose: 75 mg/dL (ref 65–99)
Potassium: 4.1 mmol/L (ref 3.5–5.2)
Sodium: 142 mmol/L (ref 134–144)

## 2020-03-19 DIAGNOSIS — G4700 Insomnia, unspecified: Secondary | ICD-10-CM | POA: Diagnosis not present

## 2020-03-19 DIAGNOSIS — F314 Bipolar disorder, current episode depressed, severe, without psychotic features: Secondary | ICD-10-CM | POA: Diagnosis not present

## 2020-03-19 DIAGNOSIS — F419 Anxiety disorder, unspecified: Secondary | ICD-10-CM | POA: Diagnosis not present

## 2020-03-23 DIAGNOSIS — M25562 Pain in left knee: Secondary | ICD-10-CM | POA: Diagnosis not present

## 2020-03-23 DIAGNOSIS — M25561 Pain in right knee: Secondary | ICD-10-CM | POA: Diagnosis not present

## 2020-03-30 DIAGNOSIS — M170 Bilateral primary osteoarthritis of knee: Secondary | ICD-10-CM | POA: Diagnosis not present

## 2020-04-03 DIAGNOSIS — I10 Essential (primary) hypertension: Secondary | ICD-10-CM | POA: Diagnosis not present

## 2020-04-07 DIAGNOSIS — G43709 Chronic migraine without aura, not intractable, without status migrainosus: Secondary | ICD-10-CM | POA: Diagnosis not present

## 2020-04-08 ENCOUNTER — Ambulatory Visit: Payer: Medicare HMO | Admitting: Cardiology

## 2020-04-13 NOTE — Progress Notes (Signed)
Patient referred by Benito Mccreedy, MD for hypertension  Subjective:   Bailey Russell, female    DOB: July 14, 1966, 54 y.o.   MRN: [id number]   Chief Complaint  Patient presents with  . Hypertension  . Results    Echocardiogram  . Follow-up    1 month    HPI  54 y.o. African American female with hypertension  She is doing well since initiation of losartan-HCTZ.Tolerting well. No complaints today.                  Current Outpatient Medications on File Prior to Visit  Medication Sig Dispense Refill  . acetaminophen (TYLENOL) 500 MG tablet Take 1,000 mg by mouth every 6 (six) hours as needed.    Marland Kitchen atenolol (TENORMIN) 25 MG tablet Take 25 mg by mouth daily.    . baclofen (LIORESAL) 10 MG tablet Take 10 mg by mouth at bedtime as needed for muscle spasms.    . busPIRone (BUSPAR) 7.5 MG tablet Take 7.5 mg by mouth 3 (three) times daily.    . cyclobenzaprine (FLEXERIL) 10 MG tablet Take 1 tablet (10 mg total) by mouth 2 (two) times daily as needed for muscle spasms. 20 tablet 0  . diphenhydramine-acetaminophen (TYLENOL PM) 25-500 MG TABS Take 3-5 tablets by mouth as needed (for migraine).     Marland Kitchen doxepin (SINEQUAN) 100 MG capsule Take 100 mg by mouth at bedtime.     Marland Kitchen FLUoxetine (PROZAC) 40 MG capsule Take 40 mg by mouth daily.    Marland Kitchen gabapentin (NEURONTIN) 600 MG tablet Take 600 mg by mouth 3 (three) times daily.    Marland Kitchen ibuprofen (ADVIL,MOTRIN) 800 MG tablet Take 800 mg by mouth every 8 (eight) hours as needed.    Marland Kitchen losartan-hydrochlorothiazide (HYZAAR) 50-12.5 MG tablet Take 1 tablet by mouth daily. 30 tablet 2  . QUEtiapine (SEROQUEL) 300 MG tablet Take 300 mg by mouth at bedtime.    . topiramate (TOPAMAX) 200 MG tablet Take 200 mg by mouth at bedtime and may repeat dose one time if needed.    . Vitamin D, Ergocalciferol, (DRISDOL) 50000 units CAPS capsule Take 50,000 Units by mouth 2 (two) times daily.     No current facility-administered medications on file prior to visit.     Cardiovascular and other pertinent studies:  Echocardiogram 03/09/2020:  Normal LV systolic function with EF 65%. Moderate concentric hypertrophy  of the left ventricle. Normal global wall motion. Left ventricle cavity is  normal in size. Indeterminate diastolic filling pattern.  No significant valvular abnormality.  Normal right atrial pressure.    EKG 03/04/2020: Sinus tachycardia 111 bpm Poor R-wave progression   Recent labs: 03/16/2020: Glucose 75, BUN/Cr 9/1.03. EGFR 72. Na/K 142/4.1.    02/03/2020: Glucose 87, BUN/Cr 11/0.95. EGFR 79. Na/K 143/4.4. Rest of the CMP normal H/H 10.3/33.2. MCV 76. Platelets 376 HbA1C 6.1%  Chol 191, TG 150, HDL 39, LDL 125 TSH 1.2 normal   Review of Systems  Cardiovascular: Negative for chest pain, dyspnea on exertion, leg swelling, palpitations and syncope.         Vitals:   04/14/20 0902  BP: 138/84  Pulse: 74  Resp: 16  SpO2: 96%     Body mass index is 41.81 kg/m. Filed Weights   04/14/20 0902  Weight: 275 lb (124.7 kg)     Objective:   Physical Exam Vitals and nursing note reviewed.  Constitutional:      General: She is not in acute distress. Neck:  Vascular: No JVD.  Cardiovascular:     Rate and Rhythm: Normal rate and regular rhythm.     Pulses: Intact distal pulses.     Heart sounds: Normal heart sounds. No murmur heard.   Pulmonary:     Effort: Pulmonary effort is normal.     Breath sounds: Normal breath sounds. No wheezing or rales.         Assessment & Recommendations:   54 y.o. African American female with hypertension  Hypertension: Recommend regular physical activity, salt restriction. On losartan-HCTZ 50-12.5 mg. BMP reviewed, tolerating well.  Echocardiogram reviewed, essentially normal.  Continue f/u w/PCPO. I will see her on as needed basis.    Nigel Mormon, MD [facility] Pager: [phone] Office: [phone]

## 2020-04-14 ENCOUNTER — Encounter: Payer: Self-pay | Admitting: Cardiology

## 2020-04-14 ENCOUNTER — Ambulatory Visit: Payer: Medicare Other | Admitting: Cardiology

## 2020-04-14 ENCOUNTER — Other Ambulatory Visit: Payer: Self-pay

## 2020-04-14 VITALS — BP 138/84 | HR 74 | Resp 16 | Ht 68.0 in | Wt 275.0 lb

## 2020-04-14 DIAGNOSIS — I10 Essential (primary) hypertension: Secondary | ICD-10-CM | POA: Diagnosis not present

## 2020-04-14 DIAGNOSIS — Z6841 Body Mass Index (BMI) 40.0 and over, adult: Secondary | ICD-10-CM | POA: Diagnosis not present

## 2020-05-04 DIAGNOSIS — I10 Essential (primary) hypertension: Secondary | ICD-10-CM | POA: Diagnosis not present

## 2020-06-01 DIAGNOSIS — Z8673 Personal history of transient ischemic attack (TIA), and cerebral infarction without residual deficits: Secondary | ICD-10-CM | POA: Diagnosis not present

## 2020-06-01 DIAGNOSIS — I10 Essential (primary) hypertension: Secondary | ICD-10-CM | POA: Diagnosis not present

## 2020-06-01 DIAGNOSIS — Z87892 Personal history of anaphylaxis: Secondary | ICD-10-CM | POA: Diagnosis not present

## 2020-06-01 DIAGNOSIS — M940 Chondrocostal junction syndrome [Tietze]: Secondary | ICD-10-CM | POA: Diagnosis not present

## 2020-06-01 DIAGNOSIS — G43009 Migraine without aura, not intractable, without status migrainosus: Secondary | ICD-10-CM | POA: Diagnosis not present

## 2020-06-01 DIAGNOSIS — Z87891 Personal history of nicotine dependence: Secondary | ICD-10-CM | POA: Diagnosis not present

## 2020-06-01 DIAGNOSIS — M1990 Unspecified osteoarthritis, unspecified site: Secondary | ICD-10-CM | POA: Diagnosis not present

## 2020-06-01 DIAGNOSIS — G43909 Migraine, unspecified, not intractable, without status migrainosus: Secondary | ICD-10-CM | POA: Diagnosis not present

## 2020-06-01 DIAGNOSIS — R001 Bradycardia, unspecified: Secondary | ICD-10-CM | POA: Diagnosis not present

## 2020-06-01 DIAGNOSIS — F319 Bipolar disorder, unspecified: Secondary | ICD-10-CM | POA: Diagnosis not present

## 2020-06-01 DIAGNOSIS — I252 Old myocardial infarction: Secondary | ICD-10-CM | POA: Diagnosis not present

## 2020-06-03 DIAGNOSIS — I10 Essential (primary) hypertension: Secondary | ICD-10-CM | POA: Diagnosis not present

## 2020-06-11 DIAGNOSIS — F314 Bipolar disorder, current episode depressed, severe, without psychotic features: Secondary | ICD-10-CM | POA: Diagnosis not present

## 2020-06-19 ENCOUNTER — Other Ambulatory Visit: Payer: Self-pay | Admitting: Cardiology

## 2020-06-19 DIAGNOSIS — I10 Essential (primary) hypertension: Secondary | ICD-10-CM

## 2020-07-04 DIAGNOSIS — I10 Essential (primary) hypertension: Secondary | ICD-10-CM | POA: Diagnosis not present

## 2020-07-07 DIAGNOSIS — I10 Essential (primary) hypertension: Secondary | ICD-10-CM | POA: Diagnosis not present

## 2020-07-07 DIAGNOSIS — Z87891 Personal history of nicotine dependence: Secondary | ICD-10-CM | POA: Diagnosis not present

## 2020-07-07 DIAGNOSIS — Z91030 Bee allergy status: Secondary | ICD-10-CM | POA: Diagnosis not present

## 2020-07-07 DIAGNOSIS — S63501A Unspecified sprain of right wrist, initial encounter: Secondary | ICD-10-CM | POA: Diagnosis not present

## 2020-07-07 DIAGNOSIS — S6391XA Sprain of unspecified part of right wrist and hand, initial encounter: Secondary | ICD-10-CM | POA: Diagnosis not present

## 2020-07-07 DIAGNOSIS — S7001XA Contusion of right hip, initial encounter: Secondary | ICD-10-CM | POA: Diagnosis not present

## 2020-07-07 DIAGNOSIS — Z8673 Personal history of transient ischemic attack (TIA), and cerebral infarction without residual deficits: Secondary | ICD-10-CM | POA: Diagnosis not present

## 2020-07-07 DIAGNOSIS — F319 Bipolar disorder, unspecified: Secondary | ICD-10-CM | POA: Diagnosis not present

## 2020-07-07 DIAGNOSIS — Z9109 Other allergy status, other than to drugs and biological substances: Secondary | ICD-10-CM | POA: Diagnosis not present

## 2020-07-07 DIAGNOSIS — S0990XA Unspecified injury of head, initial encounter: Secondary | ICD-10-CM | POA: Diagnosis not present

## 2020-07-07 DIAGNOSIS — M25561 Pain in right knee: Secondary | ICD-10-CM | POA: Diagnosis not present

## 2020-07-12 DIAGNOSIS — M25551 Pain in right hip: Secondary | ICD-10-CM | POA: Diagnosis not present

## 2020-07-12 DIAGNOSIS — M25561 Pain in right knee: Secondary | ICD-10-CM | POA: Diagnosis not present

## 2020-07-12 DIAGNOSIS — M25571 Pain in right ankle and joints of right foot: Secondary | ICD-10-CM | POA: Diagnosis not present

## 2020-07-12 DIAGNOSIS — M25531 Pain in right wrist: Secondary | ICD-10-CM | POA: Diagnosis not present

## 2020-07-14 DIAGNOSIS — G43719 Chronic migraine without aura, intractable, without status migrainosus: Secondary | ICD-10-CM | POA: Diagnosis not present

## 2020-07-15 DIAGNOSIS — F314 Bipolar disorder, current episode depressed, severe, without psychotic features: Secondary | ICD-10-CM | POA: Diagnosis not present

## 2020-08-03 DIAGNOSIS — I10 Essential (primary) hypertension: Secondary | ICD-10-CM | POA: Diagnosis not present

## 2020-08-17 DIAGNOSIS — M25551 Pain in right hip: Secondary | ICD-10-CM | POA: Diagnosis not present

## 2020-08-17 DIAGNOSIS — M25531 Pain in right wrist: Secondary | ICD-10-CM | POA: Diagnosis not present

## 2020-08-17 DIAGNOSIS — M25561 Pain in right knee: Secondary | ICD-10-CM | POA: Diagnosis not present

## 2020-08-24 DIAGNOSIS — M25561 Pain in right knee: Secondary | ICD-10-CM | POA: Diagnosis not present

## 2020-08-26 DIAGNOSIS — M25561 Pain in right knee: Secondary | ICD-10-CM | POA: Diagnosis not present

## 2020-09-03 DIAGNOSIS — Z20822 Contact with and (suspected) exposure to covid-19: Secondary | ICD-10-CM | POA: Diagnosis not present

## 2020-09-03 DIAGNOSIS — R0789 Other chest pain: Secondary | ICD-10-CM | POA: Diagnosis not present

## 2020-09-03 DIAGNOSIS — I252 Old myocardial infarction: Secondary | ICD-10-CM | POA: Diagnosis not present

## 2020-09-03 DIAGNOSIS — R911 Solitary pulmonary nodule: Secondary | ICD-10-CM | POA: Diagnosis not present

## 2020-09-03 DIAGNOSIS — G4489 Other headache syndrome: Secondary | ICD-10-CM | POA: Diagnosis not present

## 2020-09-03 DIAGNOSIS — R079 Chest pain, unspecified: Secondary | ICD-10-CM | POA: Diagnosis not present

## 2020-09-03 DIAGNOSIS — Z791 Long term (current) use of non-steroidal anti-inflammatories (NSAID): Secondary | ICD-10-CM | POA: Diagnosis not present

## 2020-09-03 DIAGNOSIS — I10 Essential (primary) hypertension: Secondary | ICD-10-CM | POA: Diagnosis not present

## 2020-09-03 DIAGNOSIS — M1990 Unspecified osteoarthritis, unspecified site: Secondary | ICD-10-CM | POA: Diagnosis not present

## 2020-09-03 DIAGNOSIS — R918 Other nonspecific abnormal finding of lung field: Secondary | ICD-10-CM | POA: Diagnosis not present

## 2020-09-03 DIAGNOSIS — R519 Headache, unspecified: Secondary | ICD-10-CM | POA: Diagnosis not present

## 2020-09-03 DIAGNOSIS — Z8673 Personal history of transient ischemic attack (TIA), and cerebral infarction without residual deficits: Secondary | ICD-10-CM | POA: Diagnosis not present

## 2020-09-10 DIAGNOSIS — Z0001 Encounter for general adult medical examination with abnormal findings: Secondary | ICD-10-CM | POA: Diagnosis not present

## 2020-09-10 DIAGNOSIS — Z720 Tobacco use: Secondary | ICD-10-CM | POA: Diagnosis not present

## 2020-09-10 DIAGNOSIS — Z1329 Encounter for screening for other suspected endocrine disorder: Secondary | ICD-10-CM | POA: Diagnosis not present

## 2020-09-10 DIAGNOSIS — I10 Essential (primary) hypertension: Secondary | ICD-10-CM | POA: Diagnosis not present

## 2020-09-10 DIAGNOSIS — L03114 Cellulitis of left upper limb: Secondary | ICD-10-CM | POA: Diagnosis not present

## 2020-09-10 DIAGNOSIS — J4520 Mild intermittent asthma, uncomplicated: Secondary | ICD-10-CM | POA: Diagnosis not present

## 2020-09-10 DIAGNOSIS — Z131 Encounter for screening for diabetes mellitus: Secondary | ICD-10-CM | POA: Diagnosis not present

## 2020-09-10 DIAGNOSIS — Z136 Encounter for screening for cardiovascular disorders: Secondary | ICD-10-CM | POA: Diagnosis not present

## 2020-09-10 DIAGNOSIS — G43009 Migraine without aura, not intractable, without status migrainosus: Secondary | ICD-10-CM | POA: Diagnosis not present

## 2020-09-13 ENCOUNTER — Other Ambulatory Visit: Payer: Self-pay

## 2020-09-13 ENCOUNTER — Ambulatory Visit: Payer: Medicare HMO | Attending: Orthopedic Surgery | Admitting: Physical Therapy

## 2020-09-13 DIAGNOSIS — M25651 Stiffness of right hip, not elsewhere classified: Secondary | ICD-10-CM | POA: Diagnosis not present

## 2020-09-13 DIAGNOSIS — M6281 Muscle weakness (generalized): Secondary | ICD-10-CM | POA: Diagnosis not present

## 2020-09-13 DIAGNOSIS — M25671 Stiffness of right ankle, not elsewhere classified: Secondary | ICD-10-CM | POA: Diagnosis not present

## 2020-09-13 DIAGNOSIS — R2689 Other abnormalities of gait and mobility: Secondary | ICD-10-CM | POA: Diagnosis not present

## 2020-09-13 DIAGNOSIS — M25571 Pain in right ankle and joints of right foot: Secondary | ICD-10-CM | POA: Diagnosis not present

## 2020-09-13 DIAGNOSIS — R262 Difficulty in walking, not elsewhere classified: Secondary | ICD-10-CM | POA: Insufficient documentation

## 2020-09-13 DIAGNOSIS — M25551 Pain in right hip: Secondary | ICD-10-CM | POA: Diagnosis not present
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
## 2020-09-13 NOTE — Patient Instructions (Signed)
    Home exercise program created by Jasey Cortez, PT.  For questions, please contact Kessie Croston via phone at [phone] or email at [email or web address]  [facility] [place] [address]  [address] [place], [place], [address] Phone: [phone]   Fax:  [phone]    

## 2020-09-15 ENCOUNTER — Ambulatory Visit: Payer: Medicare HMO

## 2020-09-15 ENCOUNTER — Other Ambulatory Visit: Payer: Self-pay

## 2020-09-15 DIAGNOSIS — M25571 Pain in right ankle and joints of right foot: Secondary | ICD-10-CM | POA: Diagnosis not present

## 2020-09-15 DIAGNOSIS — R2689 Other abnormalities of gait and mobility: Secondary | ICD-10-CM | POA: Diagnosis not present

## 2020-09-15 DIAGNOSIS — R262 Difficulty in walking, not elsewhere classified: Secondary | ICD-10-CM | POA: Diagnosis not present

## 2020-09-15 DIAGNOSIS — M25551 Pain in right hip: Secondary | ICD-10-CM

## 2020-09-15 DIAGNOSIS — M25671 Stiffness of right ankle, not elsewhere classified: Secondary | ICD-10-CM | POA: Diagnosis not present

## 2020-09-15 DIAGNOSIS — M6281 Muscle weakness (generalized): Secondary | ICD-10-CM

## 2020-09-15 DIAGNOSIS — M25651 Stiffness of right hip, not elsewhere classified: Secondary | ICD-10-CM
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
## 2020-09-20 ENCOUNTER — Ambulatory Visit: Payer: Medicare HMO

## 2020-09-22 DIAGNOSIS — M1711 Unilateral primary osteoarthritis, right knee: Secondary | ICD-10-CM | POA: Diagnosis not present

## 2020-09-22 DIAGNOSIS — S83281A Other tear of lateral meniscus, current injury, right knee, initial encounter: Secondary | ICD-10-CM | POA: Diagnosis not present

## 2020-09-22 DIAGNOSIS — S83231A Complex tear of medial meniscus, current injury, right knee, initial encounter: Secondary | ICD-10-CM | POA: Diagnosis not present

## 2020-09-22 DIAGNOSIS — M6751 Plica syndrome, right knee: Secondary | ICD-10-CM | POA: Diagnosis not present

## 2020-09-22 DIAGNOSIS — S83271A Complex tear of lateral meniscus, current injury, right knee, initial encounter: Secondary | ICD-10-CM | POA: Diagnosis not present

## 2020-09-22 DIAGNOSIS — S83241A Other tear of medial meniscus, current injury, right knee, initial encounter: Secondary | ICD-10-CM | POA: Diagnosis not present

## 2020-09-28 DIAGNOSIS — I119 Hypertensive heart disease without heart failure: Secondary | ICD-10-CM | POA: Diagnosis not present

## 2020-09-28 DIAGNOSIS — G43009 Migraine without aura, not intractable, without status migrainosus: Secondary | ICD-10-CM | POA: Diagnosis not present

## 2020-09-28 DIAGNOSIS — R911 Solitary pulmonary nodule: Secondary | ICD-10-CM | POA: Diagnosis not present

## 2020-09-28 DIAGNOSIS — E669 Obesity, unspecified: Secondary | ICD-10-CM | POA: Diagnosis not present

## 2020-09-28 DIAGNOSIS — D500 Iron deficiency anemia secondary to blood loss (chronic): Secondary | ICD-10-CM | POA: Diagnosis not present

## 2020-09-28 DIAGNOSIS — E782 Mixed hyperlipidemia: Secondary | ICD-10-CM | POA: Diagnosis not present

## 2020-09-28 DIAGNOSIS — J4520 Mild intermittent asthma, uncomplicated: Secondary | ICD-10-CM | POA: Diagnosis not present

## 2020-09-28 DIAGNOSIS — I10 Essential (primary) hypertension: Secondary | ICD-10-CM | POA: Diagnosis not present

## 2020-09-28 DIAGNOSIS — Z720 Tobacco use: Secondary | ICD-10-CM | POA: Diagnosis not present

## 2020-09-29 ENCOUNTER — Encounter: Payer: Self-pay | Admitting: Physical Therapy

## 2020-09-29 ENCOUNTER — Ambulatory Visit: Payer: Medicare HMO | Attending: Orthopedic Surgery | Admitting: Physical Therapy

## 2020-09-29 ENCOUNTER — Other Ambulatory Visit: Payer: Self-pay

## 2020-09-29 DIAGNOSIS — M25571 Pain in right ankle and joints of right foot: Secondary | ICD-10-CM | POA: Insufficient documentation

## 2020-09-29 DIAGNOSIS — M25561 Pain in right knee: Secondary | ICD-10-CM | POA: Diagnosis not present

## 2020-09-29 DIAGNOSIS — M25671 Stiffness of right ankle, not elsewhere classified: Secondary | ICD-10-CM | POA: Insufficient documentation

## 2020-09-29 DIAGNOSIS — M6281 Muscle weakness (generalized): Secondary | ICD-10-CM | POA: Insufficient documentation

## 2020-09-29 DIAGNOSIS — R262 Difficulty in walking, not elsewhere classified: Secondary | ICD-10-CM | POA: Diagnosis not present

## 2020-09-29 DIAGNOSIS — R2689 Other abnormalities of gait and mobility: Secondary | ICD-10-CM | POA: Insufficient documentation

## 2020-09-29 DIAGNOSIS — M25651 Stiffness of right hip, not elsewhere classified: Secondary | ICD-10-CM | POA: Diagnosis not present

## 2020-09-29 DIAGNOSIS — M25551 Pain in right hip: Secondary | ICD-10-CM | POA: Diagnosis not present

## 2020-09-29 DIAGNOSIS — M25661 Stiffness of right knee, not elsewhere classified: Secondary | ICD-10-CM | POA: Diagnosis not present
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
## 2020-09-29 NOTE — Patient Instructions (Signed)
     Access Code: [email or web address] URL: [email or web address] Date: 09/29/2020 Prepared by: Annie Paras  Exercises Supine Single Leg Ankle Pumps - 3 x daily - 7 x weekly - 2 sets - 10 reps - 3 sec hold Supine Quad Set - 3 x daily - 7 x weekly - 2 sets - 10 reps - 5 sec hold Active Straight Leg Raise with Quad Set - 2 x daily - 7 x weekly - 2 sets - 10 reps - 3 sec hold Standing Hip Extension with Counter Support - 2 x daily - 7 x weekly - 2 sets - 10 reps - 3 sec hold Standing Hip Abduction with Counter Support - 2 x daily - 7 x weekly - 2 sets - 10 reps - 3 sec hold

## 2020-10-01 ENCOUNTER — Ambulatory Visit: Payer: Medicare HMO | Admitting: Physical Therapy

## 2020-10-04 ENCOUNTER — Encounter: Payer: Self-pay | Admitting: Physical Therapy

## 2020-10-04 ENCOUNTER — Ambulatory Visit: Payer: Medicare HMO | Admitting: Physical Therapy

## 2020-10-04 ENCOUNTER — Other Ambulatory Visit: Payer: Self-pay

## 2020-10-04 DIAGNOSIS — R262 Difficulty in walking, not elsewhere classified: Secondary | ICD-10-CM

## 2020-10-04 DIAGNOSIS — M25561 Pain in right knee: Secondary | ICD-10-CM

## 2020-10-04 DIAGNOSIS — M25551 Pain in right hip: Secondary | ICD-10-CM

## 2020-10-04 DIAGNOSIS — M25661 Stiffness of right knee, not elsewhere classified: Secondary | ICD-10-CM | POA: Diagnosis not present

## 2020-10-04 DIAGNOSIS — M6281 Muscle weakness (generalized): Secondary | ICD-10-CM | POA: Diagnosis not present

## 2020-10-04 DIAGNOSIS — R2689 Other abnormalities of gait and mobility: Secondary | ICD-10-CM | POA: Diagnosis not present

## 2020-10-04 DIAGNOSIS — M25671 Stiffness of right ankle, not elsewhere classified: Secondary | ICD-10-CM | POA: Diagnosis not present

## 2020-10-04 DIAGNOSIS — M25651 Stiffness of right hip, not elsewhere classified: Secondary | ICD-10-CM

## 2020-10-04 DIAGNOSIS — M25571 Pain in right ankle and joints of right foot: Secondary | ICD-10-CM | POA: Diagnosis not present

## 2020-10-04 DIAGNOSIS — I10 Essential (primary) hypertension: Secondary | ICD-10-CM | POA: Diagnosis not present
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
## 2020-10-07 ENCOUNTER — Encounter: Payer: Self-pay | Admitting: Physical Therapy

## 2020-10-07 ENCOUNTER — Other Ambulatory Visit: Payer: Self-pay

## 2020-10-07 ENCOUNTER — Ambulatory Visit: Payer: Medicare HMO | Attending: Orthopedic Surgery | Admitting: Physical Therapy

## 2020-10-07 DIAGNOSIS — R262 Difficulty in walking, not elsewhere classified: Secondary | ICD-10-CM | POA: Diagnosis not present

## 2020-10-07 DIAGNOSIS — M25551 Pain in right hip: Secondary | ICD-10-CM | POA: Insufficient documentation

## 2020-10-07 DIAGNOSIS — M6281 Muscle weakness (generalized): Secondary | ICD-10-CM | POA: Diagnosis not present

## 2020-10-07 DIAGNOSIS — M25651 Stiffness of right hip, not elsewhere classified: Secondary | ICD-10-CM | POA: Diagnosis not present

## 2020-10-07 DIAGNOSIS — M25671 Stiffness of right ankle, not elsewhere classified: Secondary | ICD-10-CM | POA: Insufficient documentation

## 2020-10-07 DIAGNOSIS — R2689 Other abnormalities of gait and mobility: Secondary | ICD-10-CM | POA: Diagnosis not present

## 2020-10-07 DIAGNOSIS — M25571 Pain in right ankle and joints of right foot: Secondary | ICD-10-CM | POA: Diagnosis not present

## 2020-10-07 DIAGNOSIS — M25661 Stiffness of right knee, not elsewhere classified: Secondary | ICD-10-CM | POA: Diagnosis not present

## 2020-10-07 DIAGNOSIS — M25561 Pain in right knee: Secondary | ICD-10-CM | POA: Insufficient documentation
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
## 2020-10-11 ENCOUNTER — Ambulatory Visit: Payer: Medicare HMO | Admitting: Physical Therapy

## 2020-10-11 ENCOUNTER — Other Ambulatory Visit: Payer: Self-pay

## 2020-10-11 ENCOUNTER — Encounter: Payer: Self-pay | Admitting: Physical Therapy

## 2020-10-11 DIAGNOSIS — M25551 Pain in right hip: Secondary | ICD-10-CM

## 2020-10-11 DIAGNOSIS — M25561 Pain in right knee: Secondary | ICD-10-CM

## 2020-10-11 DIAGNOSIS — R2689 Other abnormalities of gait and mobility: Secondary | ICD-10-CM | POA: Diagnosis not present

## 2020-10-11 DIAGNOSIS — R262 Difficulty in walking, not elsewhere classified: Secondary | ICD-10-CM

## 2020-10-11 DIAGNOSIS — M25571 Pain in right ankle and joints of right foot: Secondary | ICD-10-CM | POA: Diagnosis not present

## 2020-10-11 DIAGNOSIS — M25651 Stiffness of right hip, not elsewhere classified: Secondary | ICD-10-CM | POA: Diagnosis not present

## 2020-10-11 DIAGNOSIS — M25671 Stiffness of right ankle, not elsewhere classified: Secondary | ICD-10-CM

## 2020-10-11 DIAGNOSIS — M6281 Muscle weakness (generalized): Secondary | ICD-10-CM | POA: Diagnosis not present

## 2020-10-11 DIAGNOSIS — M25661 Stiffness of right knee, not elsewhere classified: Secondary | ICD-10-CM | POA: Diagnosis not present

## 2020-10-11 NOTE — Patient Instructions (Addendum)
      Access Code: [email or web address] URL: [email or web address] Date: 10/11/2020 Prepared by: Annie Paras  Exercises Standing Hip Flexion with Anchored Resistance and Chair Support - 1 x daily - 7 x weekly - 10 reps - 3 sec hold Standing Hip Adduction with Anchored Resistance - 1 x daily - 7 x weekly - 10 reps - 3 sec hold Standing Hip Extension with Anchored Resistance - 1 x daily - 7 x weekly - 10 reps - 3 sec hold Standing Hip Abduction with Anchored Resistance - 1 x daily - 7 x weekly - 10 reps - 3 sec hold Seated Hamstring Curl with Anchored Resistance - 1 x daily - 7 x weekly - 15 reps - 3 sec hold Standing Terminal Knee Extension at Wall with Ball - 1 x daily - 7 x weekly - 2 sets - 10 reps - 5 sec hold
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
## 2020-10-13 DIAGNOSIS — F17210 Nicotine dependence, cigarettes, uncomplicated: Secondary | ICD-10-CM | POA: Diagnosis not present

## 2020-10-13 DIAGNOSIS — R053 Chronic cough: Secondary | ICD-10-CM | POA: Diagnosis not present

## 2020-10-13 DIAGNOSIS — R911 Solitary pulmonary nodule: Secondary | ICD-10-CM | POA: Diagnosis not present

## 2020-10-14 ENCOUNTER — Other Ambulatory Visit: Payer: Self-pay

## 2020-10-14 ENCOUNTER — Ambulatory Visit: Payer: Medicare HMO | Admitting: Physical Therapy

## 2020-10-14 ENCOUNTER — Encounter: Payer: Self-pay | Admitting: Physical Therapy

## 2020-10-14 DIAGNOSIS — R262 Difficulty in walking, not elsewhere classified: Secondary | ICD-10-CM

## 2020-10-14 DIAGNOSIS — M25561 Pain in right knee: Secondary | ICD-10-CM

## 2020-10-14 DIAGNOSIS — M25551 Pain in right hip: Secondary | ICD-10-CM | POA: Diagnosis not present

## 2020-10-14 DIAGNOSIS — M25671 Stiffness of right ankle, not elsewhere classified: Secondary | ICD-10-CM | POA: Diagnosis not present

## 2020-10-14 DIAGNOSIS — M6281 Muscle weakness (generalized): Secondary | ICD-10-CM | POA: Diagnosis not present

## 2020-10-14 DIAGNOSIS — M25571 Pain in right ankle and joints of right foot: Secondary | ICD-10-CM

## 2020-10-14 DIAGNOSIS — M25661 Stiffness of right knee, not elsewhere classified: Secondary | ICD-10-CM | POA: Diagnosis not present

## 2020-10-14 DIAGNOSIS — R2689 Other abnormalities of gait and mobility: Secondary | ICD-10-CM | POA: Diagnosis not present

## 2020-10-14 DIAGNOSIS — M25651 Stiffness of right hip, not elsewhere classified: Secondary | ICD-10-CM
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
## 2020-10-18 ENCOUNTER — Ambulatory Visit: Payer: Medicare HMO | Admitting: Physical Therapy

## 2020-10-19 DIAGNOSIS — R202 Paresthesia of skin: Secondary | ICD-10-CM | POA: Diagnosis not present

## 2020-10-20 DIAGNOSIS — G43719 Chronic migraine without aura, intractable, without status migrainosus: Secondary | ICD-10-CM | POA: Diagnosis not present

## 2020-10-21 ENCOUNTER — Ambulatory Visit: Payer: Medicare HMO | Admitting: Physical Therapy

## 2020-10-25 ENCOUNTER — Ambulatory Visit: Payer: Medicare HMO | Admitting: Physical Therapy

## 2020-10-28 ENCOUNTER — Ambulatory Visit: Payer: Medicare HMO | Admitting: Physical Therapy

## 2020-11-04 DIAGNOSIS — I10 Essential (primary) hypertension: Secondary | ICD-10-CM | POA: Diagnosis not present

## 2020-11-04 DIAGNOSIS — F314 Bipolar disorder, current episode depressed, severe, without psychotic features: Secondary | ICD-10-CM | POA: Diagnosis not present

## 2020-11-18 DIAGNOSIS — J385 Laryngeal spasm: Secondary | ICD-10-CM | POA: Diagnosis not present

## 2020-11-18 DIAGNOSIS — J310 Chronic rhinitis: Secondary | ICD-10-CM | POA: Diagnosis not present

## 2020-11-18 DIAGNOSIS — H903 Sensorineural hearing loss, bilateral: Secondary | ICD-10-CM | POA: Diagnosis not present

## 2020-11-18 DIAGNOSIS — H73891 Other specified disorders of tympanic membrane, right ear: Secondary | ICD-10-CM | POA: Diagnosis not present

## 2020-11-18 DIAGNOSIS — K219 Gastro-esophageal reflux disease without esophagitis: Secondary | ICD-10-CM | POA: Diagnosis not present

## 2020-11-18 DIAGNOSIS — H6121 Impacted cerumen, right ear: Secondary | ICD-10-CM | POA: Diagnosis not present

## 2020-11-26 DIAGNOSIS — R42 Dizziness and giddiness: Secondary | ICD-10-CM | POA: Diagnosis not present

## 2020-12-03 ENCOUNTER — Other Ambulatory Visit: Payer: Self-pay | Admitting: Physician Assistant

## 2020-12-03 DIAGNOSIS — Z9884 Bariatric surgery status: Secondary | ICD-10-CM | POA: Diagnosis not present

## 2020-12-03 DIAGNOSIS — K219 Gastro-esophageal reflux disease without esophagitis: Secondary | ICD-10-CM

## 2020-12-03 DIAGNOSIS — R1310 Dysphagia, unspecified: Secondary | ICD-10-CM

## 2020-12-03 DIAGNOSIS — Z8719 Personal history of other diseases of the digestive system: Secondary | ICD-10-CM | POA: Diagnosis not present

## 2020-12-04 DIAGNOSIS — I10 Essential (primary) hypertension: Secondary | ICD-10-CM | POA: Diagnosis not present

## 2020-12-14 ENCOUNTER — Ambulatory Visit
Admission: RE | Admit: 2020-12-14 | Discharge: 2020-12-14 | Disposition: A | Discharge status: Home / Self Care | Payer: Medicare HMO | Source: Ambulatory Visit | Attending: Physician Assistant | Admitting: Physician Assistant

## 2020-12-14 DIAGNOSIS — R1310 Dysphagia, unspecified: Secondary | ICD-10-CM | POA: Diagnosis not present

## 2020-12-14 DIAGNOSIS — K219 Gastro-esophageal reflux disease without esophagitis: Secondary | ICD-10-CM

## 2020-12-27 DIAGNOSIS — E782 Mixed hyperlipidemia: Secondary | ICD-10-CM | POA: Diagnosis not present

## 2020-12-27 DIAGNOSIS — I10 Essential (primary) hypertension: Secondary | ICD-10-CM | POA: Diagnosis not present

## 2020-12-27 DIAGNOSIS — J4520 Mild intermittent asthma, uncomplicated: Secondary | ICD-10-CM | POA: Diagnosis not present

## 2020-12-27 DIAGNOSIS — I119 Hypertensive heart disease without heart failure: Secondary | ICD-10-CM | POA: Diagnosis not present

## 2020-12-27 DIAGNOSIS — G43009 Migraine without aura, not intractable, without status migrainosus: Secondary | ICD-10-CM | POA: Diagnosis not present

## 2020-12-27 DIAGNOSIS — R42 Dizziness and giddiness: Secondary | ICD-10-CM | POA: Diagnosis not present

## 2020-12-27 DIAGNOSIS — E669 Obesity, unspecified: Secondary | ICD-10-CM | POA: Diagnosis not present

## 2020-12-27 DIAGNOSIS — D509 Iron deficiency anemia, unspecified: Secondary | ICD-10-CM | POA: Diagnosis not present

## 2020-12-27 DIAGNOSIS — Z720 Tobacco use: Secondary | ICD-10-CM | POA: Diagnosis not present

## 2021-01-03 DIAGNOSIS — I10 Essential (primary) hypertension: Secondary | ICD-10-CM | POA: Diagnosis not present

## 2021-01-17 DIAGNOSIS — K219 Gastro-esophageal reflux disease without esophagitis: Secondary | ICD-10-CM | POA: Diagnosis not present

## 2021-01-17 DIAGNOSIS — R053 Chronic cough: Secondary | ICD-10-CM | POA: Diagnosis not present

## 2021-01-26 DIAGNOSIS — Z1231 Encounter for screening mammogram for malignant neoplasm of breast: Secondary | ICD-10-CM | POA: Diagnosis not present

## 2021-02-01 DIAGNOSIS — S83241D Other tear of medial meniscus, current injury, right knee, subsequent encounter: Secondary | ICD-10-CM | POA: Diagnosis not present

## 2021-02-01 DIAGNOSIS — M79641 Pain in right hand: Secondary | ICD-10-CM | POA: Diagnosis not present

## 2021-02-02 DIAGNOSIS — I10 Essential (primary) hypertension: Secondary | ICD-10-CM | POA: Diagnosis not present

## 2021-02-03 DIAGNOSIS — F314 Bipolar disorder, current episode depressed, severe, without psychotic features: Secondary | ICD-10-CM | POA: Diagnosis not present

## 2021-02-17 DIAGNOSIS — G43719 Chronic migraine without aura, intractable, without status migrainosus: Secondary | ICD-10-CM | POA: Diagnosis not present

## 2021-02-17 DIAGNOSIS — F17200 Nicotine dependence, unspecified, uncomplicated: Secondary | ICD-10-CM | POA: Diagnosis not present

## 2021-02-21 DIAGNOSIS — E782 Mixed hyperlipidemia: Secondary | ICD-10-CM | POA: Diagnosis not present

## 2021-02-21 DIAGNOSIS — J4520 Mild intermittent asthma, uncomplicated: Secondary | ICD-10-CM | POA: Diagnosis not present

## 2021-02-21 DIAGNOSIS — I10 Essential (primary) hypertension: Secondary | ICD-10-CM | POA: Diagnosis not present

## 2021-02-21 DIAGNOSIS — Z0001 Encounter for general adult medical examination with abnormal findings: Secondary | ICD-10-CM | POA: Diagnosis not present

## 2021-02-21 DIAGNOSIS — Z720 Tobacco use: Secondary | ICD-10-CM | POA: Diagnosis not present

## 2021-02-21 DIAGNOSIS — D509 Iron deficiency anemia, unspecified: Secondary | ICD-10-CM | POA: Diagnosis not present

## 2021-02-21 DIAGNOSIS — R600 Localized edema: Secondary | ICD-10-CM | POA: Diagnosis not present

## 2021-02-21 DIAGNOSIS — R42 Dizziness and giddiness: Secondary | ICD-10-CM | POA: Diagnosis not present

## 2021-02-21 DIAGNOSIS — R911 Solitary pulmonary nodule: Secondary | ICD-10-CM | POA: Diagnosis not present

## 2021-02-21 DIAGNOSIS — E669 Obesity, unspecified: Secondary | ICD-10-CM | POA: Diagnosis not present

## 2021-02-21 DIAGNOSIS — G43009 Migraine without aura, not intractable, without status migrainosus: Secondary | ICD-10-CM | POA: Diagnosis not present

## 2021-02-28 ENCOUNTER — Other Ambulatory Visit: Payer: Self-pay | Admitting: Orthopedic Surgery

## 2021-02-28 DIAGNOSIS — M25531 Pain in right wrist: Secondary | ICD-10-CM

## 2021-02-28 DIAGNOSIS — S83241D Other tear of medial meniscus, current injury, right knee, subsequent encounter: Secondary | ICD-10-CM | POA: Diagnosis not present

## 2021-03-03 DIAGNOSIS — I10 Essential (primary) hypertension: Secondary | ICD-10-CM | POA: Diagnosis not present

## 2021-03-09 DIAGNOSIS — E782 Mixed hyperlipidemia: Secondary | ICD-10-CM | POA: Diagnosis not present

## 2021-03-09 DIAGNOSIS — J4520 Mild intermittent asthma, uncomplicated: Secondary | ICD-10-CM | POA: Diagnosis not present

## 2021-03-09 DIAGNOSIS — E669 Obesity, unspecified: Secondary | ICD-10-CM | POA: Diagnosis not present

## 2021-03-09 DIAGNOSIS — F17210 Nicotine dependence, cigarettes, uncomplicated: Secondary | ICD-10-CM | POA: Diagnosis not present

## 2021-03-09 DIAGNOSIS — I10 Essential (primary) hypertension: Secondary | ICD-10-CM | POA: Diagnosis not present

## 2021-03-09 DIAGNOSIS — Z720 Tobacco use: Secondary | ICD-10-CM | POA: Diagnosis not present

## 2021-03-09 DIAGNOSIS — R600 Localized edema: Secondary | ICD-10-CM | POA: Diagnosis not present

## 2021-03-09 DIAGNOSIS — R918 Other nonspecific abnormal finding of lung field: Secondary | ICD-10-CM | POA: Diagnosis not present

## 2021-03-09 DIAGNOSIS — R911 Solitary pulmonary nodule: Secondary | ICD-10-CM | POA: Diagnosis not present

## 2021-03-09 DIAGNOSIS — G43009 Migraine without aura, not intractable, without status migrainosus: Secondary | ICD-10-CM | POA: Diagnosis not present

## 2021-03-09 DIAGNOSIS — I700 Atherosclerosis of aorta: Secondary | ICD-10-CM | POA: Diagnosis not present

## 2021-03-14 ENCOUNTER — Ambulatory Visit
Admission: RE | Admit: 2021-03-14 | Discharge: 2021-03-14 | Disposition: A | Discharge status: Home / Self Care | Payer: Medicare HMO | Source: Ambulatory Visit | Attending: Orthopedic Surgery | Admitting: Orthopedic Surgery

## 2021-03-14 ENCOUNTER — Other Ambulatory Visit: Payer: Self-pay

## 2021-03-14 DIAGNOSIS — M25531 Pain in right wrist: Secondary | ICD-10-CM | POA: Diagnosis not present

## 2021-03-14 DIAGNOSIS — S56511A Strain of other extensor muscle, fascia and tendon at forearm level, right arm, initial encounter: Secondary | ICD-10-CM | POA: Diagnosis not present

## 2021-03-14 MED ORDER — IOPAMIDOL (ISOVUE-M 200) INJECTION 41%
5.0000 mL | Freq: Once | INTRAMUSCULAR | Status: AC
  Administered 2021-03-14: 5 mL via INTRA_ARTICULAR

## 2021-03-17 DIAGNOSIS — S83241D Other tear of medial meniscus, current injury, right knee, subsequent encounter: Secondary | ICD-10-CM | POA: Diagnosis not present

## 2021-04-08 DIAGNOSIS — I10 Essential (primary) hypertension: Secondary | ICD-10-CM | POA: Diagnosis not present

## 2021-04-20 DIAGNOSIS — K219 Gastro-esophageal reflux disease without esophagitis: Secondary | ICD-10-CM | POA: Diagnosis not present

## 2021-04-20 DIAGNOSIS — R053 Chronic cough: Secondary | ICD-10-CM | POA: Diagnosis not present

## 2021-04-25 DIAGNOSIS — F419 Anxiety disorder, unspecified: Secondary | ICD-10-CM | POA: Diagnosis not present

## 2021-04-30 DIAGNOSIS — G459 Transient cerebral ischemic attack, unspecified: Secondary | ICD-10-CM | POA: Diagnosis not present

## 2021-04-30 DIAGNOSIS — I10 Essential (primary) hypertension: Secondary | ICD-10-CM | POA: Diagnosis not present

## 2021-04-30 DIAGNOSIS — G43019 Migraine without aura, intractable, without status migrainosus: Secondary | ICD-10-CM | POA: Diagnosis not present

## 2021-04-30 DIAGNOSIS — F17210 Nicotine dependence, cigarettes, uncomplicated: Secondary | ICD-10-CM | POA: Diagnosis not present

## 2021-04-30 DIAGNOSIS — G43919 Migraine, unspecified, intractable, without status migrainosus: Secondary | ICD-10-CM | POA: Diagnosis not present

## 2021-04-30 DIAGNOSIS — F489 Nonpsychotic mental disorder, unspecified: Secondary | ICD-10-CM | POA: Diagnosis not present

## 2021-04-30 DIAGNOSIS — Z888 Allergy status to other drugs, medicaments and biological substances status: Secondary | ICD-10-CM | POA: Diagnosis not present

## 2021-04-30 DIAGNOSIS — I252 Old myocardial infarction: Secondary | ICD-10-CM | POA: Diagnosis not present

## 2021-04-30 DIAGNOSIS — M1990 Unspecified osteoarthritis, unspecified site: Secondary | ICD-10-CM | POA: Diagnosis not present

## 2021-04-30 DIAGNOSIS — Z91030 Bee allergy status: Secondary | ICD-10-CM | POA: Diagnosis not present

## 2021-04-30 DIAGNOSIS — Z8673 Personal history of transient ischemic attack (TIA), and cerebral infarction without residual deficits: Secondary | ICD-10-CM | POA: Diagnosis not present

## 2021-04-30 DIAGNOSIS — R519 Headache, unspecified: Secondary | ICD-10-CM | POA: Diagnosis not present

## 2021-05-09 DIAGNOSIS — I10 Essential (primary) hypertension: Secondary | ICD-10-CM | POA: Diagnosis not present

## 2021-05-17 DIAGNOSIS — M25561 Pain in right knee: Secondary | ICD-10-CM | POA: Diagnosis not present

## 2021-06-08 DIAGNOSIS — I10 Essential (primary) hypertension: Secondary | ICD-10-CM | POA: Diagnosis not present

## 2021-06-20 DIAGNOSIS — F17200 Nicotine dependence, unspecified, uncomplicated: Secondary | ICD-10-CM | POA: Diagnosis not present

## 2021-06-20 DIAGNOSIS — M25531 Pain in right wrist: Secondary | ICD-10-CM | POA: Diagnosis not present

## 2021-06-20 DIAGNOSIS — M25631 Stiffness of right wrist, not elsewhere classified: Secondary | ICD-10-CM | POA: Diagnosis not present

## 2021-06-20 DIAGNOSIS — F319 Bipolar disorder, unspecified: Secondary | ICD-10-CM | POA: Diagnosis not present

## 2021-06-20 DIAGNOSIS — M6281 Muscle weakness (generalized): Secondary | ICD-10-CM | POA: Diagnosis not present

## 2021-06-20 DIAGNOSIS — G43719 Chronic migraine without aura, intractable, without status migrainosus: Secondary | ICD-10-CM | POA: Diagnosis not present

## 2021-06-24 DIAGNOSIS — M25531 Pain in right wrist: Secondary | ICD-10-CM | POA: Diagnosis not present

## 2021-06-24 DIAGNOSIS — M25631 Stiffness of right wrist, not elsewhere classified: Secondary | ICD-10-CM | POA: Diagnosis not present

## 2021-06-24 DIAGNOSIS — M6281 Muscle weakness (generalized): Secondary | ICD-10-CM | POA: Diagnosis not present

## 2021-06-27 DIAGNOSIS — M25631 Stiffness of right wrist, not elsewhere classified: Secondary | ICD-10-CM | POA: Diagnosis not present

## 2021-06-27 DIAGNOSIS — M6281 Muscle weakness (generalized): Secondary | ICD-10-CM | POA: Diagnosis not present

## 2021-06-27 DIAGNOSIS — M25531 Pain in right wrist: Secondary | ICD-10-CM | POA: Diagnosis not present

## 2021-06-28 DIAGNOSIS — M25631 Stiffness of right wrist, not elsewhere classified: Secondary | ICD-10-CM | POA: Diagnosis not present

## 2021-06-28 DIAGNOSIS — M25531 Pain in right wrist: Secondary | ICD-10-CM | POA: Diagnosis not present

## 2021-06-28 DIAGNOSIS — M6281 Muscle weakness (generalized): Secondary | ICD-10-CM | POA: Diagnosis not present

## 2021-06-29 DIAGNOSIS — M6281 Muscle weakness (generalized): Secondary | ICD-10-CM | POA: Diagnosis not present

## 2021-06-29 DIAGNOSIS — M25631 Stiffness of right wrist, not elsewhere classified: Secondary | ICD-10-CM | POA: Diagnosis not present

## 2021-06-29 DIAGNOSIS — M25531 Pain in right wrist: Secondary | ICD-10-CM | POA: Diagnosis not present

## 2021-07-04 DIAGNOSIS — M6281 Muscle weakness (generalized): Secondary | ICD-10-CM | POA: Diagnosis not present

## 2021-07-04 DIAGNOSIS — M25631 Stiffness of right wrist, not elsewhere classified: Secondary | ICD-10-CM | POA: Diagnosis not present

## 2021-07-04 DIAGNOSIS — M25531 Pain in right wrist: Secondary | ICD-10-CM | POA: Diagnosis not present

## 2021-07-05 DIAGNOSIS — M25631 Stiffness of right wrist, not elsewhere classified: Secondary | ICD-10-CM | POA: Diagnosis not present

## 2021-07-05 DIAGNOSIS — M25531 Pain in right wrist: Secondary | ICD-10-CM | POA: Diagnosis not present

## 2021-07-05 DIAGNOSIS — M6281 Muscle weakness (generalized): Secondary | ICD-10-CM | POA: Diagnosis not present

## 2021-07-09 DIAGNOSIS — I10 Essential (primary) hypertension: Secondary | ICD-10-CM | POA: Diagnosis not present

## 2021-07-13 DIAGNOSIS — F314 Bipolar disorder, current episode depressed, severe, without psychotic features: Secondary | ICD-10-CM | POA: Diagnosis not present

## 2021-07-19 DIAGNOSIS — I10 Essential (primary) hypertension: Secondary | ICD-10-CM | POA: Diagnosis not present

## 2021-07-19 DIAGNOSIS — D508 Other iron deficiency anemias: Secondary | ICD-10-CM | POA: Diagnosis not present

## 2021-07-19 DIAGNOSIS — M25562 Pain in left knee: Secondary | ICD-10-CM | POA: Diagnosis not present

## 2021-07-19 DIAGNOSIS — Z720 Tobacco use: Secondary | ICD-10-CM | POA: Diagnosis not present

## 2021-07-19 DIAGNOSIS — J4520 Mild intermittent asthma, uncomplicated: Secondary | ICD-10-CM | POA: Diagnosis not present

## 2021-07-19 DIAGNOSIS — E782 Mixed hyperlipidemia: Secondary | ICD-10-CM | POA: Diagnosis not present

## 2021-07-19 DIAGNOSIS — G43009 Migraine without aura, not intractable, without status migrainosus: Secondary | ICD-10-CM | POA: Diagnosis not present

## 2021-07-19 DIAGNOSIS — Z2821 Immunization not carried out because of patient refusal: Secondary | ICD-10-CM | POA: Diagnosis not present

## 2021-07-21 DIAGNOSIS — M25531 Pain in right wrist: Secondary | ICD-10-CM | POA: Diagnosis not present

## 2021-07-21 DIAGNOSIS — M25631 Stiffness of right wrist, not elsewhere classified: Secondary | ICD-10-CM | POA: Diagnosis not present

## 2021-07-21 DIAGNOSIS — M6281 Muscle weakness (generalized): Secondary | ICD-10-CM | POA: Diagnosis not present

## 2021-07-26 DIAGNOSIS — M7122 Synovial cyst of popliteal space [Baker], left knee: Secondary | ICD-10-CM | POA: Diagnosis not present

## 2021-08-02 DIAGNOSIS — M7122 Synovial cyst of popliteal space [Baker], left knee: Secondary | ICD-10-CM | POA: Diagnosis not present

## 2021-08-03 DIAGNOSIS — M25531 Pain in right wrist: Secondary | ICD-10-CM | POA: Diagnosis not present

## 2021-08-03 DIAGNOSIS — M25631 Stiffness of right wrist, not elsewhere classified: Secondary | ICD-10-CM | POA: Diagnosis not present

## 2021-08-03 DIAGNOSIS — M6281 Muscle weakness (generalized): Secondary | ICD-10-CM | POA: Diagnosis not present

## 2021-08-04 DIAGNOSIS — M25631 Stiffness of right wrist, not elsewhere classified: Secondary | ICD-10-CM | POA: Diagnosis not present

## 2021-08-04 DIAGNOSIS — M6281 Muscle weakness (generalized): Secondary | ICD-10-CM | POA: Diagnosis not present

## 2021-08-04 DIAGNOSIS — M25531 Pain in right wrist: Secondary | ICD-10-CM | POA: Diagnosis not present

## 2021-08-05 DIAGNOSIS — M25531 Pain in right wrist: Secondary | ICD-10-CM | POA: Diagnosis not present

## 2021-08-05 DIAGNOSIS — M25631 Stiffness of right wrist, not elsewhere classified: Secondary | ICD-10-CM | POA: Diagnosis not present

## 2021-08-05 DIAGNOSIS — M6281 Muscle weakness (generalized): Secondary | ICD-10-CM | POA: Diagnosis not present

## 2021-08-08 DIAGNOSIS — I10 Essential (primary) hypertension: Secondary | ICD-10-CM | POA: Diagnosis not present

## 2021-08-09 DIAGNOSIS — M6281 Muscle weakness (generalized): Secondary | ICD-10-CM | POA: Diagnosis not present

## 2021-08-09 DIAGNOSIS — M25631 Stiffness of right wrist, not elsewhere classified: Secondary | ICD-10-CM | POA: Diagnosis not present

## 2021-08-09 DIAGNOSIS — M25531 Pain in right wrist: Secondary | ICD-10-CM | POA: Diagnosis not present

## 2021-08-11 DIAGNOSIS — D508 Other iron deficiency anemias: Secondary | ICD-10-CM | POA: Diagnosis not present

## 2021-08-11 DIAGNOSIS — D518 Other vitamin B12 deficiency anemias: Secondary | ICD-10-CM | POA: Diagnosis not present

## 2021-08-11 DIAGNOSIS — D509 Iron deficiency anemia, unspecified: Secondary | ICD-10-CM | POA: Diagnosis not present

## 2021-08-11 DIAGNOSIS — D519 Vitamin B12 deficiency anemia, unspecified: Secondary | ICD-10-CM | POA: Diagnosis not present

## 2021-08-11 DIAGNOSIS — Z1321 Encounter for screening for nutritional disorder: Secondary | ICD-10-CM | POA: Diagnosis not present

## 2021-08-11 DIAGNOSIS — D500 Iron deficiency anemia secondary to blood loss (chronic): Secondary | ICD-10-CM | POA: Diagnosis not present

## 2021-08-11 DIAGNOSIS — E559 Vitamin D deficiency, unspecified: Secondary | ICD-10-CM | POA: Diagnosis not present

## 2021-08-11 DIAGNOSIS — R233 Spontaneous ecchymoses: Secondary | ICD-10-CM | POA: Diagnosis not present

## 2021-08-11 DIAGNOSIS — E569 Vitamin deficiency, unspecified: Secondary | ICD-10-CM | POA: Diagnosis not present

## 2021-08-11 DIAGNOSIS — Z9884 Bariatric surgery status: Secondary | ICD-10-CM | POA: Diagnosis not present

## 2021-08-11 DIAGNOSIS — I10 Essential (primary) hypertension: Secondary | ICD-10-CM | POA: Diagnosis not present

## 2021-08-12 DIAGNOSIS — M6281 Muscle weakness (generalized): Secondary | ICD-10-CM | POA: Diagnosis not present

## 2021-08-12 DIAGNOSIS — M25631 Stiffness of right wrist, not elsewhere classified: Secondary | ICD-10-CM | POA: Diagnosis not present

## 2021-08-12 DIAGNOSIS — M25531 Pain in right wrist: Secondary | ICD-10-CM | POA: Diagnosis not present

## 2021-08-17 DIAGNOSIS — Z9884 Bariatric surgery status: Secondary | ICD-10-CM | POA: Diagnosis not present

## 2021-08-17 DIAGNOSIS — Z6841 Body Mass Index (BMI) 40.0 and over, adult: Secondary | ICD-10-CM | POA: Diagnosis not present

## 2021-08-17 DIAGNOSIS — I10 Essential (primary) hypertension: Secondary | ICD-10-CM | POA: Diagnosis not present

## 2021-08-17 DIAGNOSIS — G4733 Obstructive sleep apnea (adult) (pediatric): Secondary | ICD-10-CM | POA: Diagnosis not present

## 2021-08-17 DIAGNOSIS — F17210 Nicotine dependence, cigarettes, uncomplicated: Secondary | ICD-10-CM | POA: Diagnosis not present

## 2021-08-17 DIAGNOSIS — K219 Gastro-esophageal reflux disease without esophagitis: Secondary | ICD-10-CM | POA: Diagnosis not present

## 2021-08-22 DIAGNOSIS — F319 Bipolar disorder, unspecified: Secondary | ICD-10-CM | POA: Diagnosis not present

## 2021-08-22 DIAGNOSIS — I252 Old myocardial infarction: Secondary | ICD-10-CM | POA: Diagnosis not present

## 2021-08-22 DIAGNOSIS — R531 Weakness: Secondary | ICD-10-CM | POA: Diagnosis not present

## 2021-08-22 DIAGNOSIS — Z20822 Contact with and (suspected) exposure to covid-19: Secondary | ICD-10-CM | POA: Diagnosis not present

## 2021-08-22 DIAGNOSIS — I10 Essential (primary) hypertension: Secondary | ICD-10-CM | POA: Diagnosis not present

## 2021-08-22 DIAGNOSIS — M940 Chondrocostal junction syndrome [Tietze]: Secondary | ICD-10-CM | POA: Diagnosis not present

## 2021-08-22 DIAGNOSIS — F17210 Nicotine dependence, cigarettes, uncomplicated: Secondary | ICD-10-CM | POA: Diagnosis not present

## 2021-08-22 DIAGNOSIS — G43909 Migraine, unspecified, not intractable, without status migrainosus: Secondary | ICD-10-CM | POA: Diagnosis not present

## 2021-08-22 DIAGNOSIS — S29011A Strain of muscle and tendon of front wall of thorax, initial encounter: Secondary | ICD-10-CM | POA: Diagnosis not present

## 2021-09-06 DIAGNOSIS — Z124 Encounter for screening for malignant neoplasm of cervix: Secondary | ICD-10-CM | POA: Diagnosis not present

## 2021-09-06 DIAGNOSIS — N926 Irregular menstruation, unspecified: Secondary | ICD-10-CM | POA: Diagnosis not present

## 2021-09-06 DIAGNOSIS — Z1151 Encounter for screening for human papillomavirus (HPV): Secondary | ICD-10-CM | POA: Diagnosis not present

## 2021-09-06 DIAGNOSIS — Z01419 Encounter for gynecological examination (general) (routine) without abnormal findings: Secondary | ICD-10-CM | POA: Diagnosis not present

## 2021-09-06 DIAGNOSIS — Z59819 Housing instability, housed unspecified: Secondary | ICD-10-CM | POA: Diagnosis not present

## 2021-09-06 DIAGNOSIS — Z1331 Encounter for screening for depression: Secondary | ICD-10-CM | POA: Diagnosis not present

## 2021-09-15 ENCOUNTER — Telehealth: Payer: Self-pay | Admitting: Pharmacist

## 2021-09-15 DIAGNOSIS — G43009 Migraine without aura, not intractable, without status migrainosus: Secondary | ICD-10-CM | POA: Diagnosis not present

## 2021-09-15 DIAGNOSIS — E559 Vitamin D deficiency, unspecified: Secondary | ICD-10-CM | POA: Diagnosis not present

## 2021-09-15 DIAGNOSIS — Z720 Tobacco use: Secondary | ICD-10-CM | POA: Diagnosis not present

## 2021-09-15 DIAGNOSIS — J4520 Mild intermittent asthma, uncomplicated: Secondary | ICD-10-CM | POA: Diagnosis not present

## 2021-09-15 DIAGNOSIS — E782 Mixed hyperlipidemia: Secondary | ICD-10-CM | POA: Diagnosis not present

## 2021-09-15 DIAGNOSIS — E7211 Homocystinuria: Secondary | ICD-10-CM | POA: Diagnosis not present

## 2021-09-15 DIAGNOSIS — I10 Essential (primary) hypertension: Secondary | ICD-10-CM | POA: Diagnosis not present

## 2021-09-15 DIAGNOSIS — D508 Other iron deficiency anemias: Secondary | ICD-10-CM | POA: Diagnosis not present

## 2021-09-15 NOTE — Telephone Encounter (Signed)
Reviewed readings with pt. Pt noted to having labile BP readings with pt. Pt notes to having symptomatic anemia and feeling tired and fatigued significantly recently. This is pt's primary concerns. Recent ER visit last month noted and pt was recommended to follow up with her PCP. Pt has yet to schedule her PCP appt, but pt stated that her previous transportation concerns have resolved, and pt is planning on following up with PCP office tomorrow. Pt continues to take oral iron and recent Hbg noted to be 11.2, which appears to be pt's baseline. Pt complaining of feeling lightheaded and dizzy. Denies any recent falls or injuries. Pt reports that she hasnt noticed any recent changes in the severity or frequency of her symptoms. Pt agreeable to follow up with her PCP first and to see if she needs cardiology follow up soon. Pt was previously listed as PRN. Med list reviewed and updated. Pt only currently on clonidine 0.2 mg nightly. Recommended pt decrease the dose to 0.1 mg night and to continue monitoring her BP at home. Pt agreeable to continue monitoring her BP readings closely and changes in her symptoms. Will follow up  closely as pt's BP readings trend over the next few days.

## 2021-09-16 DIAGNOSIS — R635 Abnormal weight gain: Secondary | ICD-10-CM | POA: Diagnosis not present

## 2021-09-16 DIAGNOSIS — Z9884 Bariatric surgery status: Secondary | ICD-10-CM | POA: Diagnosis not present

## 2021-09-16 DIAGNOSIS — Z6841 Body Mass Index (BMI) 40.0 and over, adult: Secondary | ICD-10-CM | POA: Diagnosis not present

## 2021-09-16 DIAGNOSIS — K219 Gastro-esophageal reflux disease without esophagitis: Secondary | ICD-10-CM | POA: Diagnosis not present

## 2021-10-04 DIAGNOSIS — N950 Postmenopausal bleeding: Secondary | ICD-10-CM | POA: Diagnosis not present

## 2021-10-04 DIAGNOSIS — N926 Irregular menstruation, unspecified: Secondary | ICD-10-CM | POA: Diagnosis not present

## 2021-10-04 DIAGNOSIS — D259 Leiomyoma of uterus, unspecified: Secondary | ICD-10-CM | POA: Diagnosis not present

## 2021-10-04 DIAGNOSIS — Z712 Person consulting for explanation of examination or test findings: Secondary | ICD-10-CM | POA: Diagnosis not present

## 2021-10-05 DIAGNOSIS — F314 Bipolar disorder, current episode depressed, severe, without psychotic features: Secondary | ICD-10-CM | POA: Diagnosis not present

## 2021-10-06 DIAGNOSIS — K047 Periapical abscess without sinus: Secondary | ICD-10-CM | POA: Diagnosis not present

## 2021-10-06 DIAGNOSIS — J310 Chronic rhinitis: Secondary | ICD-10-CM | POA: Diagnosis not present

## 2021-10-09 DIAGNOSIS — I10 Essential (primary) hypertension: Secondary | ICD-10-CM | POA: Diagnosis not present

## 2021-10-14 DIAGNOSIS — I889 Nonspecific lymphadenitis, unspecified: Secondary | ICD-10-CM | POA: Diagnosis not present

## 2021-10-28 DIAGNOSIS — D649 Anemia, unspecified: Secondary | ICD-10-CM | POA: Diagnosis not present

## 2021-10-28 DIAGNOSIS — K5900 Constipation, unspecified: Secondary | ICD-10-CM | POA: Diagnosis not present

## 2021-11-08 DIAGNOSIS — I10 Essential (primary) hypertension: Secondary | ICD-10-CM | POA: Diagnosis not present

## 2021-11-09 DIAGNOSIS — N858 Other specified noninflammatory disorders of uterus: Secondary | ICD-10-CM | POA: Diagnosis not present

## 2021-11-09 DIAGNOSIS — N950 Postmenopausal bleeding: Secondary | ICD-10-CM | POA: Diagnosis not present

## 2021-11-09 DIAGNOSIS — R9389 Abnormal findings on diagnostic imaging of other specified body structures: Secondary | ICD-10-CM | POA: Diagnosis not present

## 2021-11-17 DIAGNOSIS — M25571 Pain in right ankle and joints of right foot: Secondary | ICD-10-CM | POA: Diagnosis not present

## 2021-11-17 DIAGNOSIS — M545 Low back pain, unspecified: Secondary | ICD-10-CM | POA: Diagnosis not present

## 2021-12-12 DIAGNOSIS — K5900 Constipation, unspecified: Secondary | ICD-10-CM | POA: Diagnosis not present

## 2021-12-12 DIAGNOSIS — D508 Other iron deficiency anemias: Secondary | ICD-10-CM | POA: Diagnosis not present

## 2021-12-14 ENCOUNTER — Telehealth: Payer: Self-pay | Admitting: Oncology

## 2021-12-14 NOTE — Telephone Encounter (Signed)
Attempted to contact patient in regards to scheduling referral, no answer  and voicemail was full so unable to leave message. Option of sending referral over to [place]  will be offered to patient if she calls back, patient lives closer to [facility]. ?

## 2021-12-15 ENCOUNTER — Other Ambulatory Visit: Payer: Medicare HMO

## 2021-12-15 ENCOUNTER — Encounter: Payer: Medicare HMO | Admitting: Hematology & Oncology

## 2021-12-16 ENCOUNTER — Encounter: Payer: Self-pay | Admitting: Family

## 2021-12-16 ENCOUNTER — Inpatient Hospital Stay (HOSPITAL_BASED_OUTPATIENT_CLINIC_OR_DEPARTMENT_OTHER): Payer: Medicare HMO | Admitting: Family

## 2021-12-16 ENCOUNTER — Other Ambulatory Visit: Payer: Self-pay | Admitting: Family

## 2021-12-16 ENCOUNTER — Inpatient Hospital Stay: Payer: Medicare HMO | Attending: Hematology & Oncology

## 2021-12-16 ENCOUNTER — Other Ambulatory Visit: Payer: Self-pay

## 2021-12-16 DIAGNOSIS — Z8759 Personal history of other complications of pregnancy, childbirth and the puerperium: Secondary | ICD-10-CM | POA: Insufficient documentation

## 2021-12-16 DIAGNOSIS — D649 Anemia, unspecified: Secondary | ICD-10-CM

## 2021-12-16 DIAGNOSIS — R110 Nausea: Secondary | ICD-10-CM

## 2021-12-16 DIAGNOSIS — Z8249 Family history of ischemic heart disease and other diseases of the circulatory system: Secondary | ICD-10-CM | POA: Insufficient documentation

## 2021-12-16 DIAGNOSIS — R5383 Other fatigue: Secondary | ICD-10-CM | POA: Diagnosis not present

## 2021-12-16 DIAGNOSIS — Z9049 Acquired absence of other specified parts of digestive tract: Secondary | ICD-10-CM | POA: Diagnosis not present

## 2021-12-16 DIAGNOSIS — Z79899 Other long term (current) drug therapy: Secondary | ICD-10-CM

## 2021-12-16 DIAGNOSIS — Z87891 Personal history of nicotine dependence: Secondary | ICD-10-CM | POA: Insufficient documentation

## 2021-12-16 DIAGNOSIS — D509 Iron deficiency anemia, unspecified: Secondary | ICD-10-CM

## 2021-12-16 DIAGNOSIS — R202 Paresthesia of skin: Secondary | ICD-10-CM | POA: Diagnosis not present

## 2021-12-16 DIAGNOSIS — Z886 Allergy status to analgesic agent status: Secondary | ICD-10-CM | POA: Diagnosis not present

## 2021-12-16 DIAGNOSIS — K912 Postsurgical malabsorption, not elsewhere classified: Secondary | ICD-10-CM | POA: Insufficient documentation

## 2021-12-16 DIAGNOSIS — K5909 Other constipation: Secondary | ICD-10-CM | POA: Insufficient documentation

## 2021-12-16 DIAGNOSIS — R531 Weakness: Secondary | ICD-10-CM

## 2021-12-16 DIAGNOSIS — Z9884 Bariatric surgery status: Secondary | ICD-10-CM

## 2021-12-16 DIAGNOSIS — Z8719 Personal history of other diseases of the digestive system: Secondary | ICD-10-CM | POA: Insufficient documentation

## 2021-12-16 DIAGNOSIS — Z87440 Personal history of urinary (tract) infections: Secondary | ICD-10-CM | POA: Insufficient documentation

## 2021-12-16 DIAGNOSIS — Z888 Allergy status to other drugs, medicaments and biological substances status: Secondary | ICD-10-CM | POA: Insufficient documentation

## 2021-12-16 DIAGNOSIS — I10 Essential (primary) hypertension: Secondary | ICD-10-CM | POA: Insufficient documentation

## 2021-12-16 DIAGNOSIS — D508 Other iron deficiency anemias: Secondary | ICD-10-CM

## 2021-12-16 LAB — CBC WITH DIFFERENTIAL (CANCER CENTER ONLY)
Abs Immature Granulocytes: 0.05 10*3/uL (ref 0.00–0.07)
Basophils Absolute: 0.1 10*3/uL (ref 0.0–0.1)
Basophils Relative: 1 %
Eosinophils Absolute: 0.2 10*3/uL (ref 0.0–0.5)
Eosinophils Relative: 3 %
HCT: 35.8 % — ABNORMAL LOW (ref 36.0–46.0)
Hemoglobin: 11.1 g/dL — ABNORMAL LOW (ref 12.0–15.0)
Immature Granulocytes: 1 %
Lymphocytes Relative: 42 %
Lymphs Abs: 3.1 10*3/uL (ref 0.7–4.0)
MCH: 27.1 pg (ref 26.0–34.0)
MCHC: 31 g/dL (ref 30.0–36.0)
MCV: 87.3 fL (ref 80.0–100.0)
Monocytes Absolute: 0.5 10*3/uL (ref 0.1–1.0)
Monocytes Relative: 7 %
Neutro Abs: 3.4 10*3/uL (ref 1.7–7.7)
Neutrophils Relative %: 46 %
Platelet Count: 325 10*3/uL (ref 150–400)
RBC: 4.1 MIL/uL (ref 3.87–5.11)
RDW: 15.5 % (ref 11.5–15.5)
WBC Count: 7.4 10*3/uL (ref 4.0–10.5)
nRBC: 0 % (ref 0.0–0.2)

## 2021-12-16 LAB — RETICULOCYTES
Immature Retic Fract: 24.7 % — ABNORMAL HIGH (ref 2.3–15.9)
RBC.: 4.13 MIL/uL (ref 3.87–5.11)
Retic Count, Absolute: 61.9 10*3/uL (ref 19.0–186.0)
Retic Ct Pct: 1.5 % (ref 0.4–3.1)

## 2021-12-16 LAB — LACTATE DEHYDROGENASE: LDH: 157 U/L (ref 98–192)

## 2021-12-16 LAB — SAVE SMEAR(SSMR), FOR PROVIDER SLIDE REVIEW

## 2021-12-16 NOTE — Progress Notes (Signed)
Hematology/Oncology Consultation  ? ?Name: Bailey Russell      MRN: [id number]    Location: Room/bed info not found  Date: 12/16/2021 Time:9:27 AM ? ? ?REFERRING PHYSICIAN: Arvella Nigh. Sheck, PA-C ? ?REASON FOR CONSULT: Anemia unspecified ?  ?DIAGNOSIS: Iron deficiency anemia secondary to malabsorption, s/p gastric bypass 2018  ? ?HISTORY OF PRESENT ILLNESS:  [name] is a very pleasant 56 yo African American female with a 2 or so year history of iron deficiency anemia.  ?She did not respond to oral iron supplementation.  ?She had gastric bypass in February 2018.  ?Iron saturation was 8% and ferritin 7 last week. Hgb today is 11.1, MCV 91, platelets 365 and WBC count is 9.5.  ?She is symptomatic with fatigue and weakness.  ?Her last cycle was in December 2022. She states that before that it had been almost 2 years. She states that with her cycle she was regular with normal flow that included some clots.  ?She states that her last colonoscopy was a couple years ago with Dr. Paulita Fujita and she had a polyp removed and noted diverticulitis. She states that she is due again in 2026.  ?No obvious blood loss noted. No bruising or petechiae.  ?No family history of anemia.  ?No personal history of cancer.  ?Her mother passed away from leukemia.  ?No history of diabetes or thyroid disease.  ?She has 4 children and history of 1 ectopic pregnancy and 2 miscarriages.  ?She has history of migraines that will cause nausea.  ?She also has history of dizziness with vertigo.  ?No fever, chills, cough, rash, SOB, chest pain, palpitations, abdominal pain or changes in bowel or bladder habits.  ?She has chronic constipation and has taken stool softeners as needed.  ?No swelling or tenderness in her extremities.  ?She has intermittent tingling in her hands.  ?Her last fall was in February and she states that this was due to an episode of vertigo. No syncope.  ?She stopped smoking in January. No ETOH or recreational drug use.   ?She has a good appetite and is doing her best to stay well hydrated. Her weight is 283 lbs.  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  ? ?ROS: All other 10 point review of systems is negative.  ? ?PAST MEDICAL HISTORY:   ?Past Medical History:  ?Diagnosis Date  ? Acid reflux   ? no issues since bypass  ? Anxiety   ? Arthritis   ? Depression   ? Ectopic pregnancy   ? Esophageal stricture   ? Headache(784.0)   ? migraines  ? Hiatal hernia   ? Hypertension   ? Inflammatory polyps of colon ([place])   ? Pneumonia   ? pneumonia last month not hospitalized  ? Sleep apnea   ? history of CPAP prior to gastric bypass, pt taken self off, over 1 year ago  ? Urinary tract infection   ? ? ?ALLERGIES: ?Allergies  ?Allergen Reactions  ? Lisinopril Cough  ? Ramipril Cough  ? Naproxen Nausea Only  ? ?   ?MEDICATIONS:  ?Current Outpatient Medications on File Prior to Visit  ?Medication Sig Dispense Refill  ? acetaminophen (TYLENOL) 500 MG tablet Take 1,000 mg by mouth every 6 (six) hours as needed.    ? ALBUTEROL SULFATE HFA IN Inhale 2 puffs into the lungs every 6 (six) hours as needed.    ? cloNIDine (CATAPRES) 0.2 MG tablet Take 0.1 mg by mouth at bedtime.    ?  cloNIDine (CATAPRES) 0.2 MG tablet Take 1 tablet by mouth at bedtime.    ? cyclobenzaprine (FLEXERIL) 10 MG tablet Take 1 tablet (10 mg total) by mouth 2 (two) times daily as needed for muscle spasms. 20 tablet 0  ? doxepin (SINEQUAN) 100 MG capsule Take 100 mg by mouth at bedtime.     ? Galcanezumab-gnlm (EMGALITY) 120 MG/ML SOAJ Inject 120 mg into the skin every 30 (thirty) days.    ? QUEtiapine (SEROQUEL) 300 MG tablet Take 300 mg by mouth at bedtime.    ? topiramate (TOPAMAX) 200 MG tablet Take 200 mg by mouth at bedtime and may repeat dose one time if needed.    ? Vitamin D, Ergocalciferol, (DRISDOL) 50000 units CAPS capsule Take 50,000 Units by mouth 2 (two) times daily.    ? ?No current facility-administered medications on file prior to visit.  ? ?  ?PAST  SURGICAL HISTORY ?Past Surgical History:  ?Procedure Laterality Date  ? APPENDECTOMY    ? BREAST LUMPECTOMY WITH RADIOACTIVE SEED LOCALIZATION Right 03/14/2018  ? Procedure: BREAST LUMPECTOMY WITH RADIOACTIVE SEED LOCALIZATION;  Surgeon: Jovita Kussmaul, MD;  Location: [facility];  Service: General;  Laterality: Right;  ? CHOLECYSTECTOMY    ? ECTOPIC PREGNANCY SURGERY    ? GASTRIC BYPASS    ? february 2018  ? KNEE SURGERY    ? LAPAROSCOPY    ? X 2 to remove scar tissue  ? mass removed from R buttock    ? OVARIAN CYST REMOVAL    ? polypectomy    ? polyps removed from colon during colonoscopy  ? TONSILLECTOMY    ? WISDOM TOOTH EXTRACTION    ? ? ?FAMILY HISTORY: ?Family History  ?Problem Relation Age of Onset  ? Hypertension Mother   ? Hypertension Sister   ? Hypertension Brother   ? Anesthesia problems Neg Hx   ? Diabetes Neg Hx   ? Heart attack Neg Hx   ? Hyperlipidemia Neg Hx   ? Sudden death Neg Hx   ? ? ?SOCIAL HISTORY: ? reports that she has been smoking cigarettes. She has a 0.50 pack-year smoking history. She has never used smokeless tobacco. She reports current alcohol use. She reports that she does not use drugs. ? ?PERFORMANCE STATUS: ?The patient's performance status is 1 - Symptomatic but completely ambulatory ? ?PHYSICAL EXAM: ?Most Recent Vital Signs: There were no vitals taken for this visit. ?BP (!) 132/99 (BP Location: Left Wrist, Patient Position: Sitting)   Pulse 86   Temp 99.2 ?F (37.3 ?C) (Oral)   Resp 18   Ht '5\' 8"'$  (1.727 m)   Wt 283 lb 1.3 oz (128.4 kg)   SpO2 98%   BMI 43.04 kg/m?  ? ?General Appearance:    Alert, cooperative, no distress, appears stated age  ?Head:    Normocephalic, without obvious abnormality, atraumatic  ?Eyes:    PERRL, conjunctiva/corneas clear, EOM's intact, fundi  ?  benign, both eyes  ?   ?   ?Throat:   Lips, mucosa, and tongue normal; teeth and gums normal  ?Neck:   Supple, symmetrical, trachea midline, no adenopathy;  ?  thyroid:  no  enlargement/tenderness/nodules; no carotid ?  bruit or JVD  ?Back:     Symmetric, no curvature, ROM normal, no CVA tenderness  ?Lungs:     Clear to auscultation bilaterally, respirations unlabored  ?Chest Wall:    No tenderness or deformity  ? Heart:    Regular rate and rhythm, S1  and S2 normal, no murmur, rub   or gallop  ?   ?Abdomen:     Soft, non-tender, bowel sounds active all four quadrants,  ?  no masses, no organomegaly  ?   ?   ?Extremities:   Extremities normal, atraumatic, no cyanosis or edema  ?Pulses:   2+ and symmetric all extremities  ?Skin:   Skin color, texture, turgor normal, no rashes or lesions  ?Lymph nodes:   Cervical, supraclavicular, and axillary nodes normal  ?Neurologic:   CNII-XII intact, normal strength, sensation and reflexes  ?  throughout  ? ? ?LABORATORY DATA:  ?Results for orders placed or performed in visit on 12/16/21 (from the past 48 hour(s))  ?Save Smear [facility])     Status: None  ? Collection Time: 12/16/21  8:44 AM  ?Result Value Ref Range  ? Smear Review SMEAR STAINED AND AVAILABLE FOR REVIEW   ?  Comment: Performed at [facility] Lab at [facility], [address], [place], [place] [address]  ?Reticulocytes     Status: Abnormal  ? Collection Time: 12/16/21  8:44 AM  ?Result Value Ref Range  ? Retic Ct Pct 1.5 0.4 - 3.1 %  ? RBC. 4.13 3.87 - 5.11 MIL/uL  ? Retic Count, Absolute 61.9 19.0 - 186.0 K/uL  ? Immature Retic Fract 24.7 (H) 2.3 - 15.9 %  ?  Comment: Performed at [facility] Lab at [facility], [address], [place], [place] [address]  ?CBC with Differential (Cancer Center Only)     Status: Abnormal  ? Collection Time: 12/16/21  8:44 AM  ?Result Value Ref Range  ? WBC Count 7.4 4.0 - 10.5 K/uL  ? RBC 4.10 3.87 - 5.11 MIL/uL  ? Hemoglobin 11.1 (L) 12.0 - 15.0 g/dL  ? HCT 35.8 (L) 36.0 - 46.0 %  ? MCV 87.3 80.0 - 100.0 fL  ? MCH 27.1 26.0 - 34.0 pg  ? MCHC 31.0 30.0 - 36.0 g/dL  ? RDW 15.5 11.5 - 15.5 %  ? Platelet Count  325 150 - 400 K/uL  ? nRBC 0.0 0.0 - 0.2 %  ? Neutrophils Relative % 46 %  ? Neutro Abs 3.4 1.7 - 7.7 K/uL  ? Lymphocytes Relative 42 %  ? Lymphs Abs 3.1 0.7 - 4.0 K/uL  ? Monocytes Relative 7 %  ? Monocytes Absolute 0.5 0.1 - 1

## 2021-12-17 LAB — ERYTHROPOIETIN: Erythropoietin: 50.2 m[IU]/mL — ABNORMAL HIGH (ref 2.6–18.5)

## 2021-12-19 DIAGNOSIS — F17200 Nicotine dependence, unspecified, uncomplicated: Secondary | ICD-10-CM | POA: Diagnosis not present

## 2021-12-19 DIAGNOSIS — F319 Bipolar disorder, unspecified: Secondary | ICD-10-CM | POA: Diagnosis not present

## 2021-12-19 DIAGNOSIS — G43719 Chronic migraine without aura, intractable, without status migrainosus: Secondary | ICD-10-CM | POA: Diagnosis not present

## 2021-12-21 ENCOUNTER — Inpatient Hospital Stay: Payer: Medicare HMO

## 2021-12-21 VITALS — BP 129/71 | HR 72 | Temp 98.9°F | Resp 18

## 2021-12-21 DIAGNOSIS — R5383 Other fatigue: Secondary | ICD-10-CM | POA: Diagnosis not present

## 2021-12-21 DIAGNOSIS — I10 Essential (primary) hypertension: Secondary | ICD-10-CM | POA: Diagnosis not present

## 2021-12-21 DIAGNOSIS — D509 Iron deficiency anemia, unspecified: Secondary | ICD-10-CM

## 2021-12-21 DIAGNOSIS — R531 Weakness: Secondary | ICD-10-CM | POA: Diagnosis not present

## 2021-12-21 DIAGNOSIS — K5909 Other constipation: Secondary | ICD-10-CM | POA: Diagnosis not present

## 2021-12-21 DIAGNOSIS — K912 Postsurgical malabsorption, not elsewhere classified: Secondary | ICD-10-CM | POA: Diagnosis not present

## 2021-12-21 DIAGNOSIS — R202 Paresthesia of skin: Secondary | ICD-10-CM | POA: Diagnosis not present

## 2021-12-21 DIAGNOSIS — D508 Other iron deficiency anemias: Secondary | ICD-10-CM | POA: Diagnosis not present

## 2021-12-21 DIAGNOSIS — Z9884 Bariatric surgery status: Secondary | ICD-10-CM | POA: Diagnosis not present

## 2021-12-21 DIAGNOSIS — R110 Nausea: Secondary | ICD-10-CM | POA: Diagnosis not present

## 2021-12-21 MED ORDER — SODIUM CHLORIDE 0.9 % IV SOLN: Freq: Once | INTRAVENOUS | Status: AC

## 2021-12-21 MED ORDER — SODIUM CHLORIDE 0.9 % IV SOLN
300.0000 mg | Freq: Once | INTRAVENOUS | Status: AC
  Administered 2021-12-21: 300 mg via INTRAVENOUS
  Filled 2021-12-21: qty 300

## 2021-12-21 NOTE — Patient Instructions (Signed)

## 2021-12-25 DIAGNOSIS — Z888 Allergy status to other drugs, medicaments and biological substances status: Secondary | ICD-10-CM | POA: Diagnosis not present

## 2021-12-25 DIAGNOSIS — Z886 Allergy status to analgesic agent status: Secondary | ICD-10-CM | POA: Diagnosis not present

## 2021-12-25 DIAGNOSIS — J986 Disorders of diaphragm: Secondary | ICD-10-CM | POA: Diagnosis not present

## 2021-12-25 DIAGNOSIS — I10 Essential (primary) hypertension: Secondary | ICD-10-CM | POA: Diagnosis not present

## 2021-12-25 DIAGNOSIS — Z87891 Personal history of nicotine dependence: Secondary | ICD-10-CM | POA: Diagnosis not present

## 2021-12-25 DIAGNOSIS — M940 Chondrocostal junction syndrome [Tietze]: Secondary | ICD-10-CM | POA: Diagnosis not present

## 2021-12-25 DIAGNOSIS — R079 Chest pain, unspecified: Secondary | ICD-10-CM | POA: Diagnosis not present

## 2021-12-25 DIAGNOSIS — Z91030 Bee allergy status: Secondary | ICD-10-CM | POA: Diagnosis not present

## 2021-12-25 DIAGNOSIS — R000 Tachycardia, unspecified: Secondary | ICD-10-CM | POA: Diagnosis not present

## 2021-12-25 DIAGNOSIS — Z91048 Other nonmedicinal substance allergy status: Secondary | ICD-10-CM | POA: Diagnosis not present

## 2021-12-25 DIAGNOSIS — G43909 Migraine, unspecified, not intractable, without status migrainosus: Secondary | ICD-10-CM | POA: Diagnosis not present

## 2021-12-26 DIAGNOSIS — Z712 Person consulting for explanation of examination or test findings: Secondary | ICD-10-CM | POA: Diagnosis not present

## 2021-12-26 DIAGNOSIS — R9389 Abnormal findings on diagnostic imaging of other specified body structures: Secondary | ICD-10-CM | POA: Diagnosis not present

## 2021-12-27 DIAGNOSIS — K219 Gastro-esophageal reflux disease without esophagitis: Secondary | ICD-10-CM | POA: Diagnosis not present

## 2021-12-27 DIAGNOSIS — R079 Chest pain, unspecified: Secondary | ICD-10-CM | POA: Diagnosis not present

## 2021-12-27 DIAGNOSIS — Z9884 Bariatric surgery status: Secondary | ICD-10-CM | POA: Diagnosis not present

## 2021-12-28 ENCOUNTER — Encounter: Payer: Self-pay | Admitting: *Deleted

## 2021-12-28 ENCOUNTER — Inpatient Hospital Stay: Payer: Medicare HMO

## 2021-12-28 ENCOUNTER — Other Ambulatory Visit: Payer: Self-pay | Admitting: Family

## 2021-12-28 NOTE — Progress Notes (Signed)
Pt here for Iron infusion appt. Reports she went to ED 2days after receiving Venofer.  Allergy list updated. ?

## 2021-12-28 NOTE — Progress Notes (Signed)
Discussed iron reaction with Gillian Shields. Pt to get another brand of iron. Her insurance covers Infed which pt understands is a longer treatment so she will come in next week for her infusion. She understands we will also send her reaction information to her insurance to see if she qualifies for anything else. Pt declines any other questions or concerns at this time  ?

## 2021-12-28 NOTE — Progress Notes (Signed)
Patient experienced an anaphylactic type reaction and went to the ED for treatment after her first cycle of Venofer. Will try and get Feraheme approved and pre medicate prior to with benadryl, tylenol, solumedrol and pepcid. Patient in agreement with the plan.  ?

## 2021-12-29 DIAGNOSIS — K1120 Sialoadenitis, unspecified: Secondary | ICD-10-CM | POA: Diagnosis not present

## 2021-12-29 DIAGNOSIS — J329 Chronic sinusitis, unspecified: Secondary | ICD-10-CM | POA: Diagnosis not present

## 2022-01-04 ENCOUNTER — Inpatient Hospital Stay: Payer: Medicare HMO | Attending: Hematology & Oncology

## 2022-01-04 VITALS — BP 129/68 | HR 83 | Temp 99.3°F | Resp 18

## 2022-01-04 DIAGNOSIS — D509 Iron deficiency anemia, unspecified: Secondary | ICD-10-CM

## 2022-01-04 DIAGNOSIS — Z79899 Other long term (current) drug therapy: Secondary | ICD-10-CM | POA: Insufficient documentation

## 2022-01-04 DIAGNOSIS — D500 Iron deficiency anemia secondary to blood loss (chronic): Secondary | ICD-10-CM | POA: Diagnosis not present

## 2022-01-04 DIAGNOSIS — Z9884 Bariatric surgery status: Secondary | ICD-10-CM | POA: Diagnosis not present

## 2022-01-04 DIAGNOSIS — Z8249 Family history of ischemic heart disease and other diseases of the circulatory system: Secondary | ICD-10-CM | POA: Insufficient documentation

## 2022-01-04 DIAGNOSIS — I10 Essential (primary) hypertension: Secondary | ICD-10-CM | POA: Diagnosis not present

## 2022-01-04 DIAGNOSIS — K909 Intestinal malabsorption, unspecified: Secondary | ICD-10-CM | POA: Diagnosis not present

## 2022-01-04 DIAGNOSIS — R9389 Abnormal findings on diagnostic imaging of other specified body structures: Secondary | ICD-10-CM | POA: Diagnosis not present

## 2022-01-04 DIAGNOSIS — N950 Postmenopausal bleeding: Secondary | ICD-10-CM | POA: Diagnosis not present

## 2022-01-04 MED ORDER — SODIUM CHLORIDE 0.9 % IV SOLN: Freq: Once | INTRAVENOUS | Status: AC

## 2022-01-04 MED ORDER — METHYLPREDNISOLONE SODIUM SUCC 125 MG IJ SOLR
60.0000 mg | Freq: Once | INTRAMUSCULAR | Status: AC
  Administered 2022-01-04: 60 mg via INTRAVENOUS
  Filled 2022-01-04: qty 2

## 2022-01-04 MED ORDER — DIPHENHYDRAMINE HCL 25 MG PO CAPS
50.0000 mg | ORAL_CAPSULE | Freq: Once | ORAL | Status: AC
  Administered 2022-01-04: 50 mg via ORAL
  Filled 2022-01-04: qty 2

## 2022-01-04 MED ORDER — ACETAMINOPHEN 325 MG PO TABS
650.0000 mg | ORAL_TABLET | Freq: Once | ORAL | Status: AC
  Administered 2022-01-04: 650 mg via ORAL
  Filled 2022-01-04: qty 2

## 2022-01-04 MED ORDER — FAMOTIDINE IN NACL 20-0.9 MG/50ML-% IV SOLN
20.0000 mg | Freq: Once | INTRAVENOUS | Status: AC
  Administered 2022-01-04: 20 mg via INTRAVENOUS
  Filled 2022-01-04: qty 50

## 2022-01-04 MED ORDER — SODIUM CHLORIDE 0.9 % IV SOLN
510.0000 mg | Freq: Once | INTRAVENOUS | Status: AC
  Administered 2022-01-04: 510 mg via INTRAVENOUS
  Filled 2022-01-04: qty 510

## 2022-01-04 NOTE — Patient Instructions (Signed)

## 2022-01-05 ENCOUNTER — Encounter: Payer: Self-pay | Admitting: Family

## 2022-01-06 ENCOUNTER — Inpatient Hospital Stay: Payer: Medicare HMO

## 2022-01-06 DIAGNOSIS — R079 Chest pain, unspecified: Secondary | ICD-10-CM | POA: Diagnosis not present

## 2022-01-06 DIAGNOSIS — Z9884 Bariatric surgery status: Secondary | ICD-10-CM | POA: Diagnosis not present

## 2022-01-06 DIAGNOSIS — K219 Gastro-esophageal reflux disease without esophagitis: Secondary | ICD-10-CM | POA: Diagnosis not present

## 2022-01-07 DIAGNOSIS — K1120 Sialoadenitis, unspecified: Secondary | ICD-10-CM | POA: Diagnosis not present

## 2022-01-10 ENCOUNTER — Inpatient Hospital Stay: Payer: Medicare HMO

## 2022-01-10 VITALS — BP 126/70 | HR 70 | Temp 98.7°F | Resp 18

## 2022-01-10 DIAGNOSIS — Z8249 Family history of ischemic heart disease and other diseases of the circulatory system: Secondary | ICD-10-CM | POA: Diagnosis not present

## 2022-01-10 DIAGNOSIS — K909 Intestinal malabsorption, unspecified: Secondary | ICD-10-CM | POA: Diagnosis not present

## 2022-01-10 DIAGNOSIS — D500 Iron deficiency anemia secondary to blood loss (chronic): Secondary | ICD-10-CM | POA: Diagnosis not present

## 2022-01-10 DIAGNOSIS — D509 Iron deficiency anemia, unspecified: Secondary | ICD-10-CM

## 2022-01-10 DIAGNOSIS — Z9884 Bariatric surgery status: Secondary | ICD-10-CM | POA: Diagnosis not present

## 2022-01-10 DIAGNOSIS — Z79899 Other long term (current) drug therapy: Secondary | ICD-10-CM | POA: Diagnosis not present

## 2022-01-10 MED ORDER — DIPHENHYDRAMINE HCL 25 MG PO CAPS
50.0000 mg | ORAL_CAPSULE | Freq: Once | ORAL | Status: AC
  Administered 2022-01-10: 50 mg via ORAL
  Filled 2022-01-10: qty 2

## 2022-01-10 MED ORDER — METHYLPREDNISOLONE SODIUM SUCC 125 MG IJ SOLR
60.0000 mg | Freq: Once | INTRAMUSCULAR | Status: AC
  Administered 2022-01-10: 60 mg via INTRAVENOUS
  Filled 2022-01-10: qty 2

## 2022-01-10 MED ORDER — ACETAMINOPHEN 325 MG PO TABS
650.0000 mg | ORAL_TABLET | Freq: Once | ORAL | Status: AC
  Administered 2022-01-10: 650 mg via ORAL
  Filled 2022-01-10: qty 2

## 2022-01-10 MED ORDER — SODIUM CHLORIDE 0.9 % IV SOLN: Freq: Once | INTRAVENOUS | Status: AC

## 2022-01-10 MED ORDER — FAMOTIDINE IN NACL 20-0.9 MG/50ML-% IV SOLN
20.0000 mg | Freq: Once | INTRAVENOUS | Status: AC
  Administered 2022-01-10: 20 mg via INTRAVENOUS
  Filled 2022-01-10: qty 50

## 2022-01-10 MED ORDER — SODIUM CHLORIDE 0.9 % IV SOLN
510.0000 mg | Freq: Once | INTRAVENOUS | Status: AC
  Administered 2022-01-10: 510 mg via INTRAVENOUS
  Filled 2022-01-10: qty 17

## 2022-01-10 NOTE — Patient Instructions (Signed)

## 2022-01-13 DIAGNOSIS — D649 Anemia, unspecified: Secondary | ICD-10-CM | POA: Diagnosis not present

## 2022-01-13 DIAGNOSIS — D508 Other iron deficiency anemias: Secondary | ICD-10-CM | POA: Diagnosis not present

## 2022-01-24 DIAGNOSIS — R9389 Abnormal findings on diagnostic imaging of other specified body structures: Secondary | ICD-10-CM | POA: Diagnosis not present

## 2022-01-24 DIAGNOSIS — G4459 Other complicated headache syndrome: Secondary | ICD-10-CM | POA: Diagnosis not present

## 2022-02-01 DIAGNOSIS — Z1231 Encounter for screening mammogram for malignant neoplasm of breast: Secondary | ICD-10-CM | POA: Diagnosis not present

## 2022-02-01 IMAGING — XA DG FLUORO GUIDE NDL PLC/BX
3 series · 3 of 3 positions shown · non-contrast
Comparison: none

CLINICAL DATA: Fall.  Normal lateral right wrist pain.

[Series 1: ortho standard · 1 of 1 slices shown (1 of 3)]
[im 1/1]
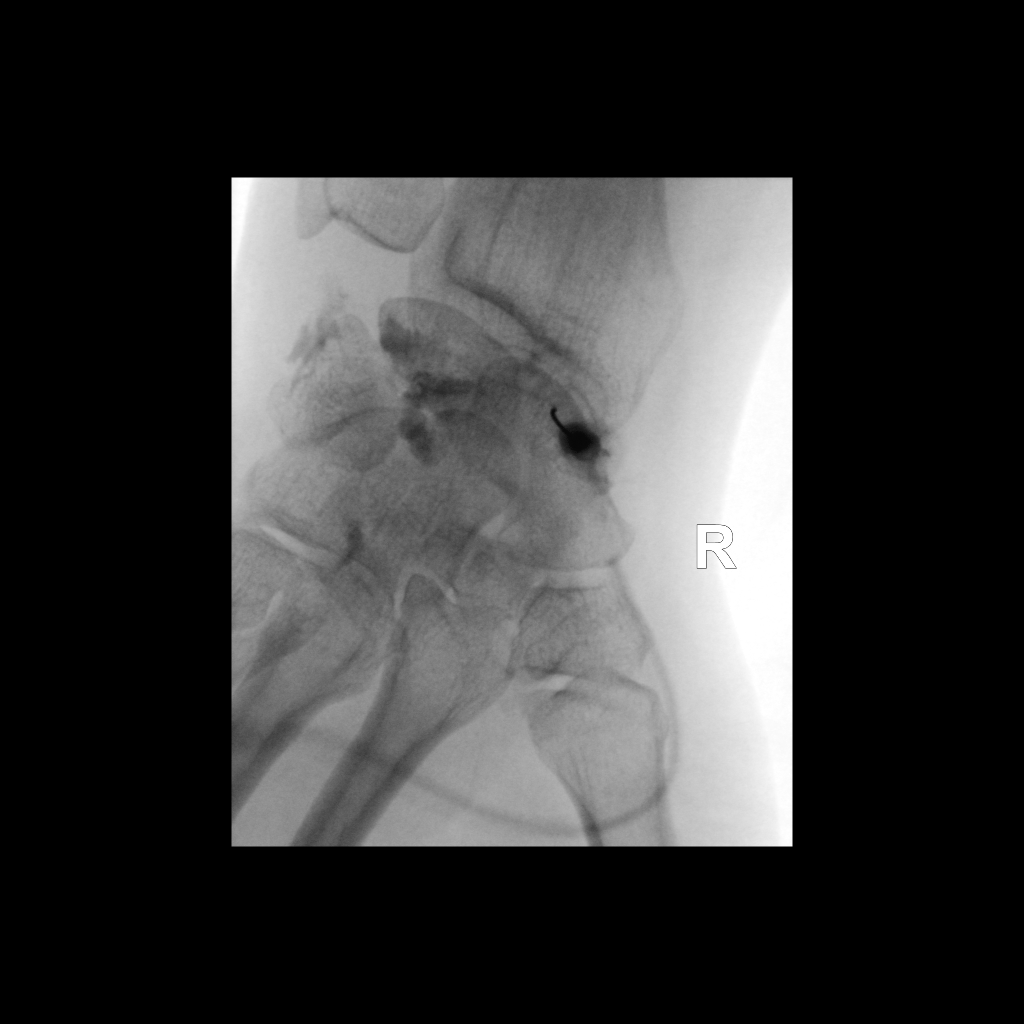

[Series 2: ortho standard · 1 of 1 slices shown (2 of 3)]
[im 1/1]
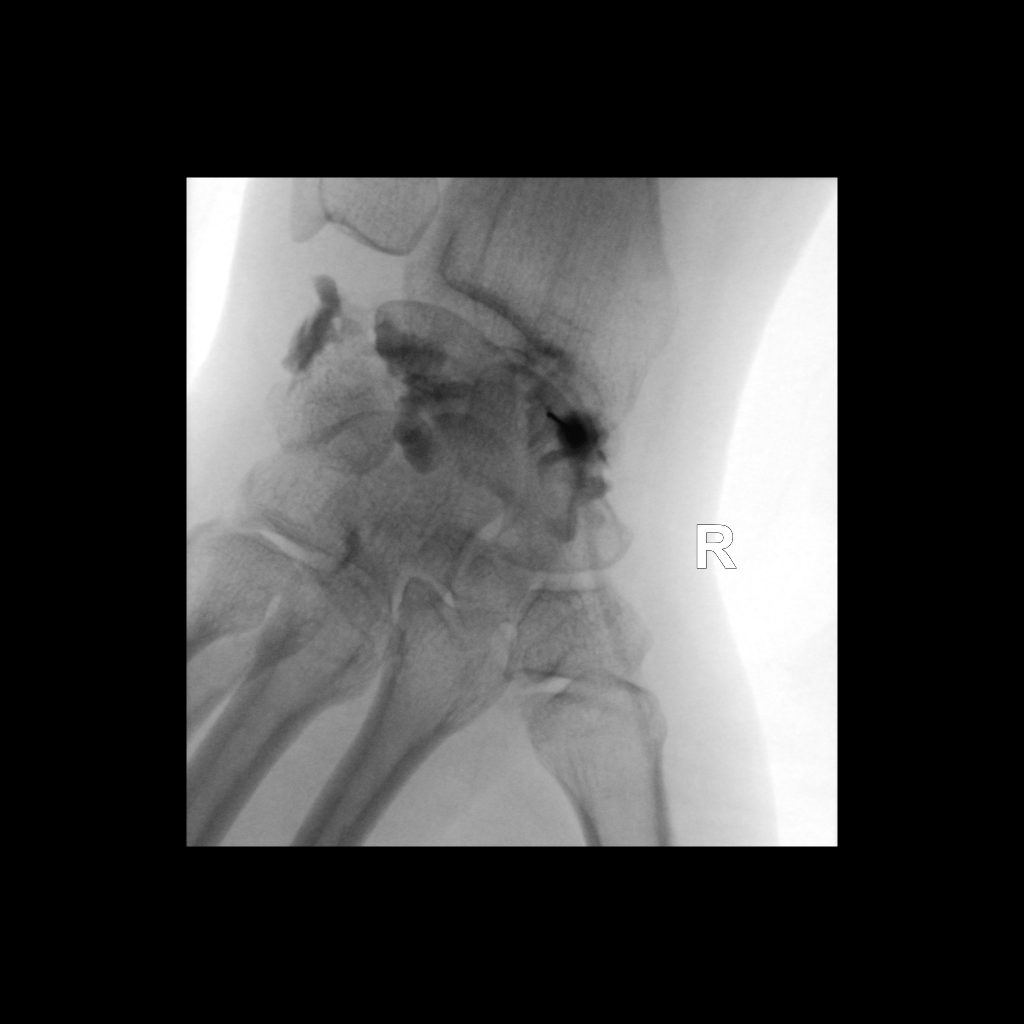

[Series 3: ortho standard · 1 of 1 slices shown (3 of 3)]
[im 1/1]
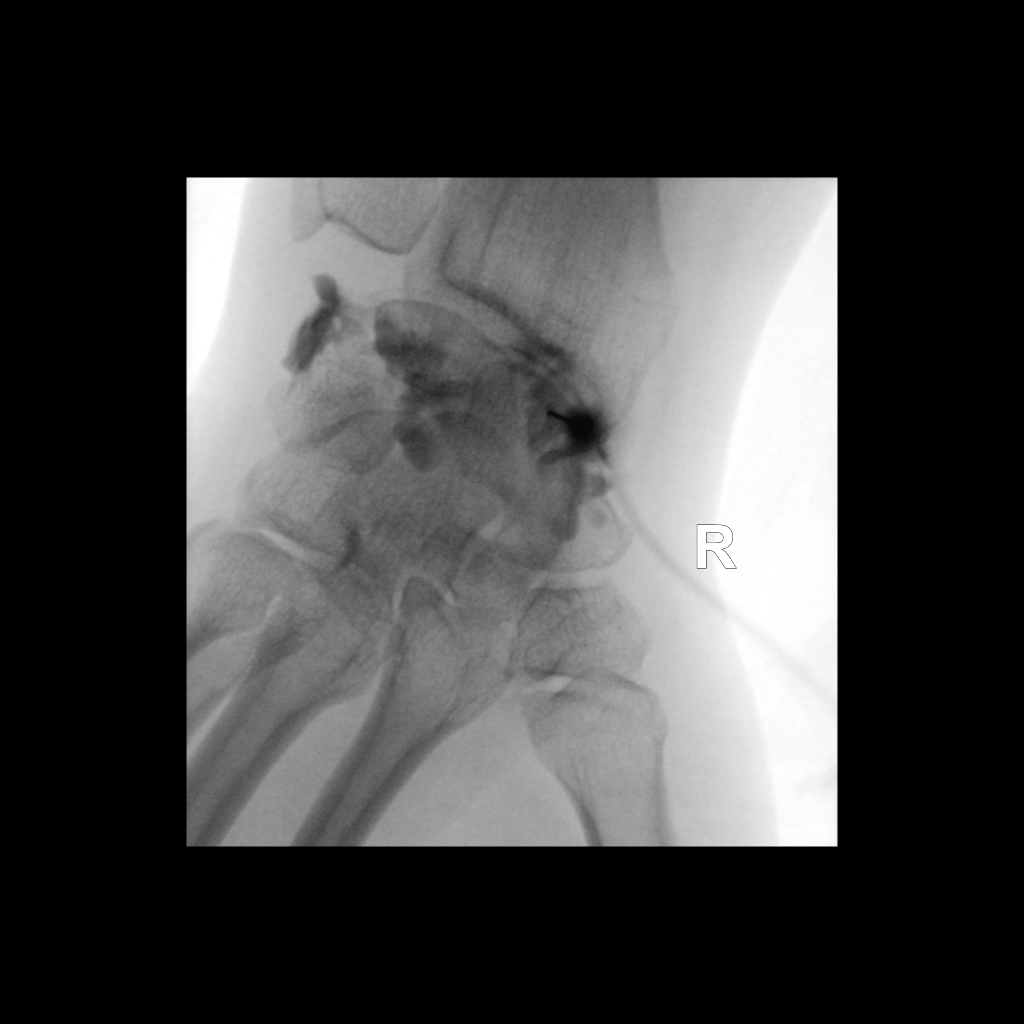

[3 of 3 positions shown; findings below may reference images not displayed]

FLUOROSCOPY TIME:  Radiation Exposure Index (as provided by the
fluoroscopic device): 4.88 uGy*m2

PROCEDURE:
Right WRIST INJECTION UNDER FLUOROSCOPY

An appropriate skin entrance site was determined. The site was
marked, prepped with Betadine, draped in the usual sterile fashion,
and infiltrated locally with 1% Lidocaine. A 25 gauge skin needle
was advanced into the radiocarpal joint under intermittent
fluoroscopy. A mixture of 0.1 mL MultiHance and 20 mL of dilute
Isovue M 200 was then used to opacify the proximal carpal joint. No
immediate complication.
IMPRESSION: Technically successful right wrist injection for MRI.

## 2022-02-01 IMAGING — MR MR WRIST*R* W/ CM
6 series · 35 of 40 positions shown · IV contrast (agent unspecified)
Comparison: Right wrist x-rays dated June 16, 2013.

CONTRAST:  See injection documentation.

CLINICAL DATA: Chronic right wrist pain. No injury or prior
surgery.

EXAM:
MR OF THE RIGHT WRIST WITH CONTRAST (MR ARTHROGRAM)
TECHNIQUE: Multiplanar, multisequence MR imaging of the right wrist was
performed following the administration of intra-articular contrast.

[Series 4: T1 fat-sat · oblique · 3.0mm · 0.20mm/px · 8 of 25 slices shown (1 of 3)]
[im 1/25]
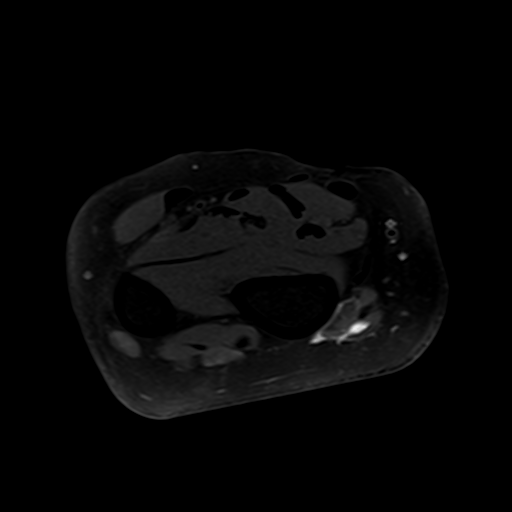
[im 4/25]
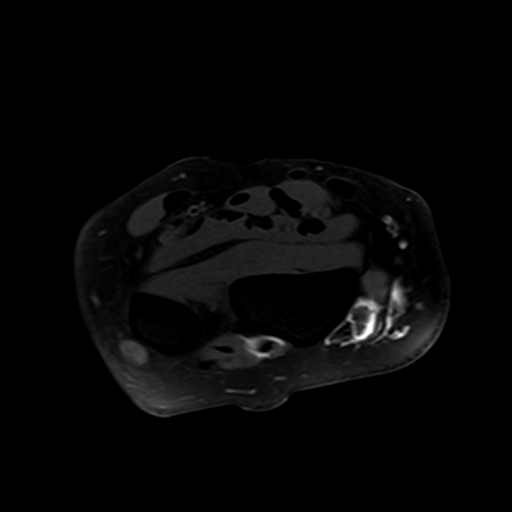
[im 7/25]
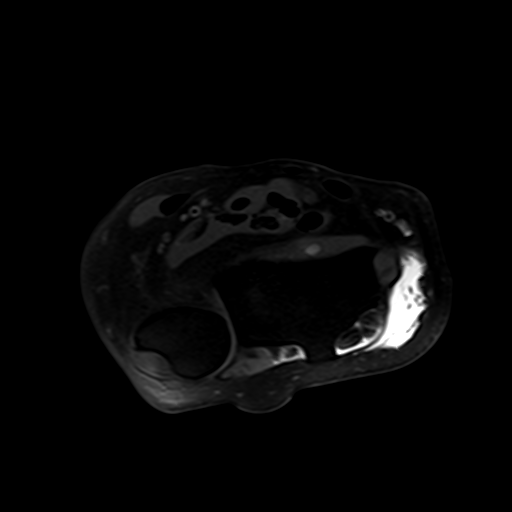
[im 11/25]
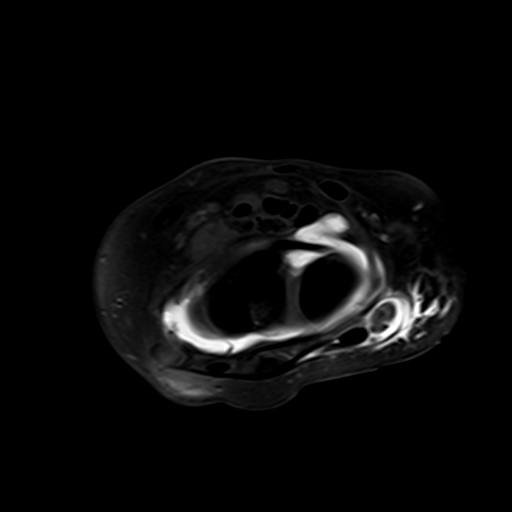
[im 14/25]
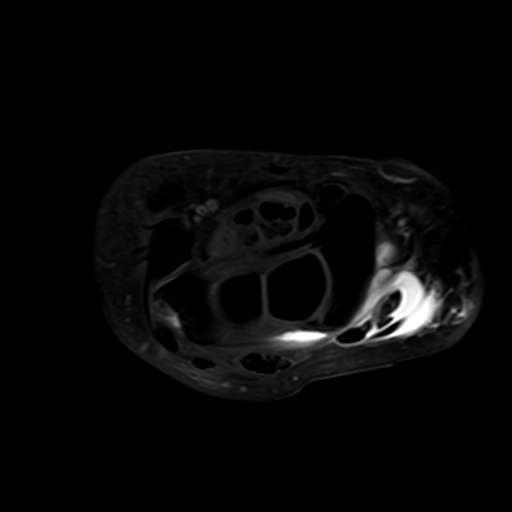
[im 18/25]
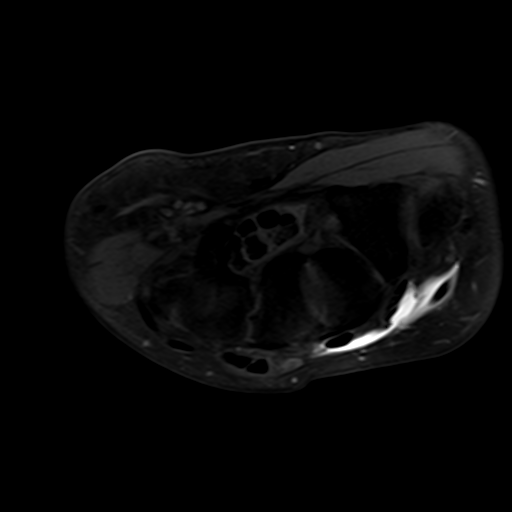
[im 21/25]
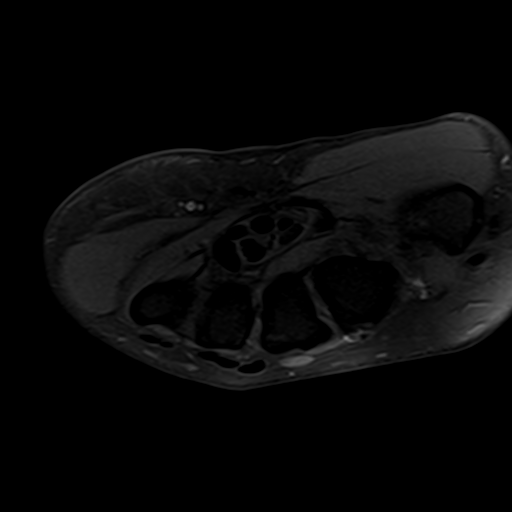
[im 25/25]
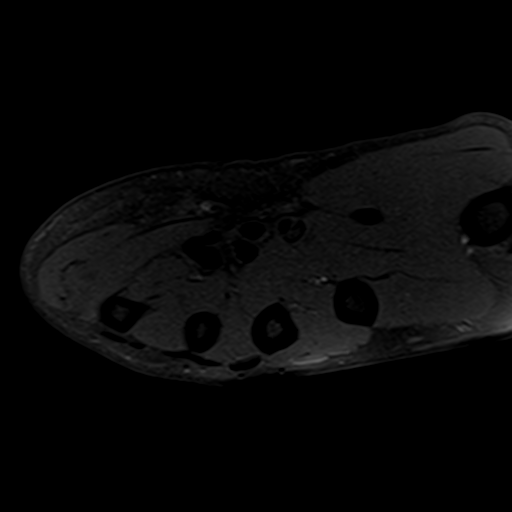

[Series 5: T2 fat-sat · oblique · 3.0mm · 0.39mm/px · 7 of 24 slices shown (1 of 3)]
[im 1/24]
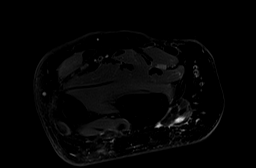
[im 4/24]
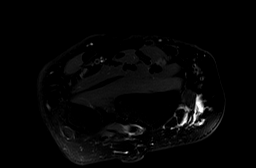
[im 8/24]
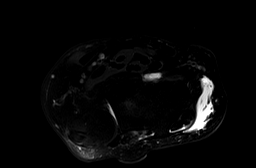
[im 12/24]
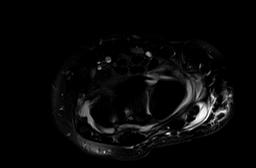
[im 16/24]
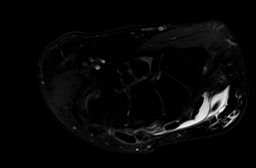
[im 20/24]
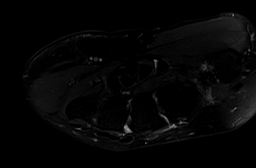
[im 24/24]
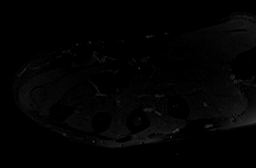

[Series 6: T1 fat-sat · coronal · 3.0mm · 0.20mm/px · 6 of 19 slices shown (2 of 3)]
[im 1/19]
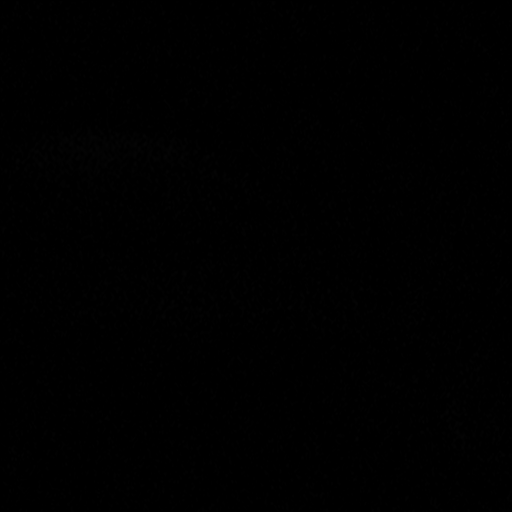
[im 4/19]
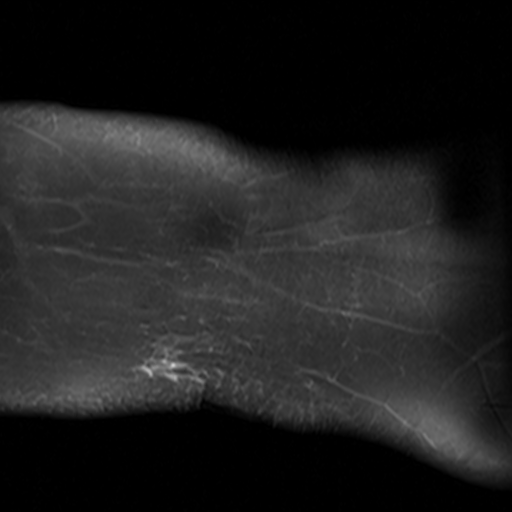
[im 8/19]
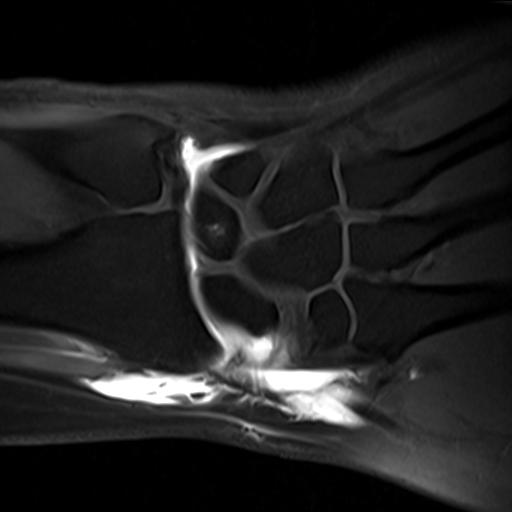
[im 11/19]
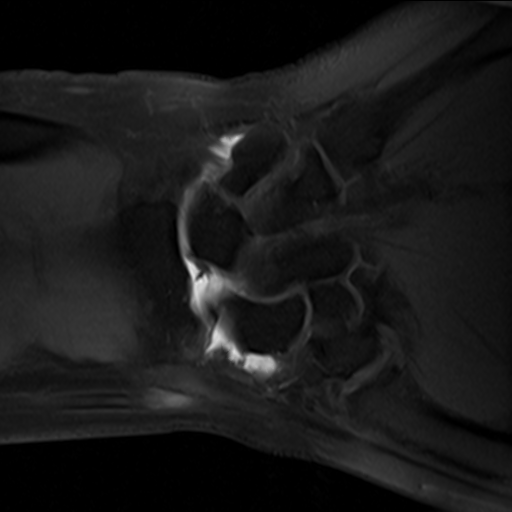
[im 15/19]
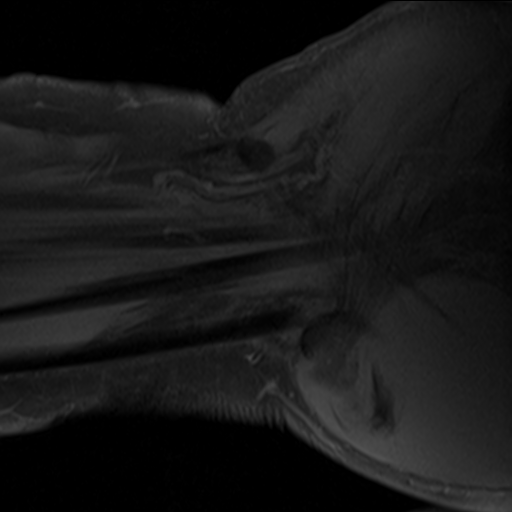
[im 19/19]
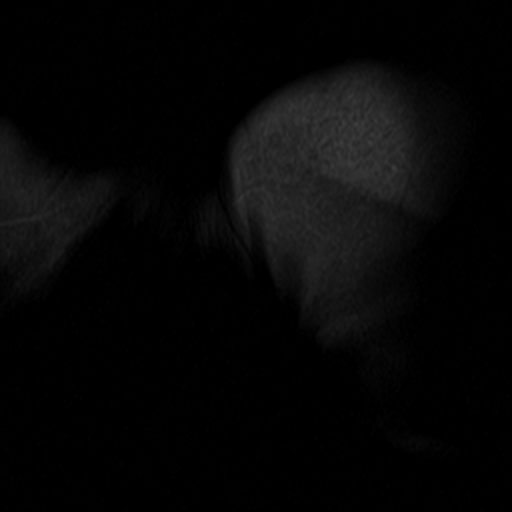

[Series 7: T1 fat-sat · coronal · 3.0mm · 0.31mm/px · 1 of 19 slices shown (3 of 3)]
[im 1/19]
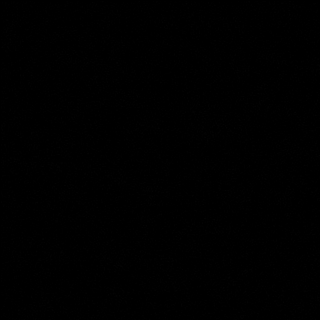

[Series 8: T2 fat-sat · coronal · 3.0mm · 0.39mm/px · 6 of 19 slices shown (2 of 3)]
[im 1/19]
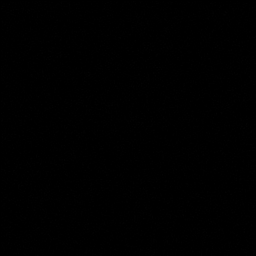
[im 4/19]
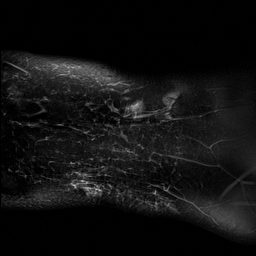
[im 8/19]
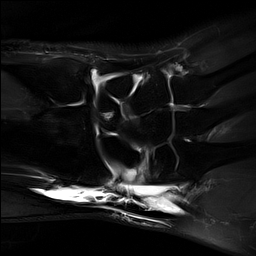
[im 11/19]
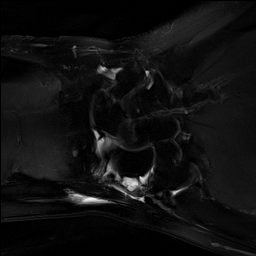
[im 15/19]
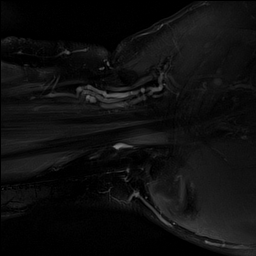
[im 19/19]
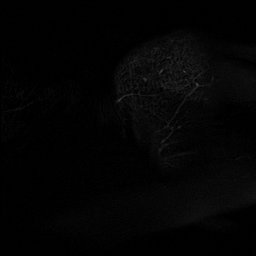

[Series 9: T2 fat-sat · oblique · 3.0mm · 0.39mm/px · 7 of 24 slices shown (3 of 3)]
[im 1/24]
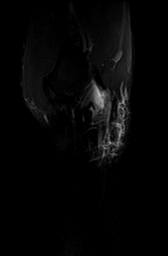
[im 4/24]
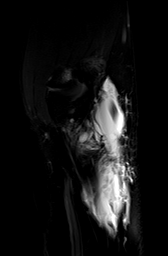
[im 8/24]
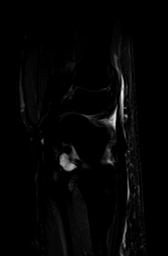
[im 12/24]
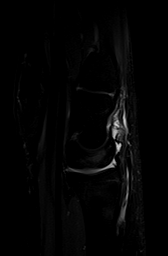
[im 16/24]
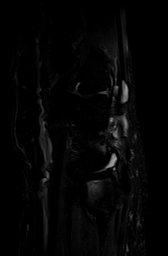
[im 20/24]
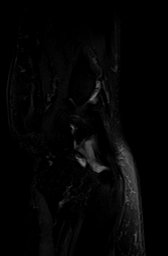
[im 24/24]
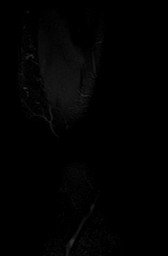

[35 of 40 positions shown; findings below may reference images not displayed]

FINDINGS: Ligaments: Intact scapholunate and lunotriquetral ligaments.

Triangular fibrocartilage: Intact TFCC. Degeneration of the dorsal
radioulnar ligament.

Tendons: Mild to moderate extensor carpi radialis longus tendinosis
with small partial tear (series 5, image 13). Mild to moderate
extensor carpi ulnaris tendinosis. Small amount of fluid in the
fourth extensor compartment tendon sheath. Iatrogenic contrast
within the second and third extensor compartment tendon sheaths.
Intact flexor compartment tendons.

Carpal tunnel/median nerve: Normal carpal tunnel. Normal median
nerve.

Guyon's canal: Normal.

Joint/cartilage: The radiocarpal joint is distended with
intra-articular contrast. Mild first CMC joint osteoarthritis.

Bones/carpal alignment: No acute fracture or dislocation. Mild
marrow edema in the ulnar aspect of the distal radial metaphysis. No
suspicious bone lesion. Small cyst in the dorsal lunate.

Other: There are two ganglion cysts volar to the distal radius and
scaphoid, measuring 7 x 3 x 9 mm and 12 x 6 x 9 mm.
IMPRESSION: 1. Mild to moderate extensor carpi radialis longus tendinosis with
small partial tear.
2. Mild to moderate extensor carpi ulnaris tendinosis.
3. Intact TFCC.  Degeneration of the dorsal radioulnar ligament.
4. Two small ganglion cysts volar to the distal radius and scaphoid.

## 2022-02-07 ENCOUNTER — Other Ambulatory Visit: Payer: Self-pay | Admitting: Family

## 2022-02-07 DIAGNOSIS — G4459 Other complicated headache syndrome: Secondary | ICD-10-CM | POA: Diagnosis not present

## 2022-02-10 ENCOUNTER — Inpatient Hospital Stay: Payer: Medicare HMO

## 2022-02-10 ENCOUNTER — Ambulatory Visit: Payer: Medicare HMO

## 2022-02-10 ENCOUNTER — Inpatient Hospital Stay: Payer: Medicare HMO | Admitting: Family

## 2022-02-10 DIAGNOSIS — N950 Postmenopausal bleeding: Secondary | ICD-10-CM | POA: Diagnosis not present

## 2022-02-10 DIAGNOSIS — G4730 Sleep apnea, unspecified: Secondary | ICD-10-CM | POA: Diagnosis not present

## 2022-02-10 DIAGNOSIS — K219 Gastro-esophageal reflux disease without esophagitis: Secondary | ICD-10-CM | POA: Diagnosis not present

## 2022-02-10 DIAGNOSIS — N840 Polyp of corpus uteri: Secondary | ICD-10-CM | POA: Diagnosis not present

## 2022-02-10 DIAGNOSIS — Z6841 Body Mass Index (BMI) 40.0 and over, adult: Secondary | ICD-10-CM | POA: Diagnosis not present

## 2022-02-10 DIAGNOSIS — I2510 Atherosclerotic heart disease of native coronary artery without angina pectoris: Secondary | ICD-10-CM | POA: Diagnosis not present

## 2022-02-10 DIAGNOSIS — I252 Old myocardial infarction: Secondary | ICD-10-CM | POA: Diagnosis not present

## 2022-02-10 DIAGNOSIS — R9389 Abnormal findings on diagnostic imaging of other specified body structures: Secondary | ICD-10-CM | POA: Diagnosis not present

## 2022-02-10 DIAGNOSIS — I10 Essential (primary) hypertension: Secondary | ICD-10-CM | POA: Diagnosis not present

## 2022-02-10 DIAGNOSIS — Z8673 Personal history of transient ischemic attack (TIA), and cerebral infarction without residual deficits: Secondary | ICD-10-CM | POA: Diagnosis not present

## 2022-02-10 DIAGNOSIS — F319 Bipolar disorder, unspecified: Secondary | ICD-10-CM | POA: Diagnosis not present

## 2022-02-14 ENCOUNTER — Inpatient Hospital Stay: Payer: Medicare HMO

## 2022-02-14 ENCOUNTER — Inpatient Hospital Stay (HOSPITAL_BASED_OUTPATIENT_CLINIC_OR_DEPARTMENT_OTHER): Payer: Medicare HMO | Admitting: Family

## 2022-02-14 ENCOUNTER — Encounter: Payer: Self-pay | Admitting: Family

## 2022-02-14 ENCOUNTER — Other Ambulatory Visit: Payer: Self-pay | Admitting: Oncology

## 2022-02-14 ENCOUNTER — Inpatient Hospital Stay: Payer: Medicare HMO | Attending: Hematology & Oncology

## 2022-02-14 VITALS — BP 128/87 | HR 104 | Temp 98.4°F | Resp 17 | Wt 280.8 lb

## 2022-02-14 DIAGNOSIS — R0602 Shortness of breath: Secondary | ICD-10-CM | POA: Insufficient documentation

## 2022-02-14 DIAGNOSIS — K909 Intestinal malabsorption, unspecified: Secondary | ICD-10-CM | POA: Diagnosis not present

## 2022-02-14 DIAGNOSIS — D508 Other iron deficiency anemias: Secondary | ICD-10-CM | POA: Insufficient documentation

## 2022-02-14 DIAGNOSIS — R5383 Other fatigue: Secondary | ICD-10-CM | POA: Insufficient documentation

## 2022-02-14 DIAGNOSIS — Z888 Allergy status to other drugs, medicaments and biological substances status: Secondary | ICD-10-CM | POA: Insufficient documentation

## 2022-02-14 DIAGNOSIS — R200 Anesthesia of skin: Secondary | ICD-10-CM | POA: Diagnosis not present

## 2022-02-14 DIAGNOSIS — R079 Chest pain, unspecified: Secondary | ICD-10-CM | POA: Insufficient documentation

## 2022-02-14 DIAGNOSIS — Z9884 Bariatric surgery status: Secondary | ICD-10-CM | POA: Insufficient documentation

## 2022-02-14 DIAGNOSIS — D509 Iron deficiency anemia, unspecified: Secondary | ICD-10-CM

## 2022-02-14 DIAGNOSIS — Z79899 Other long term (current) drug therapy: Secondary | ICD-10-CM | POA: Insufficient documentation

## 2022-02-14 DIAGNOSIS — K5909 Other constipation: Secondary | ICD-10-CM | POA: Insufficient documentation

## 2022-02-14 DIAGNOSIS — R002 Palpitations: Secondary | ICD-10-CM | POA: Insufficient documentation

## 2022-02-14 DIAGNOSIS — R202 Paresthesia of skin: Secondary | ICD-10-CM | POA: Diagnosis not present

## 2022-02-14 DIAGNOSIS — Z886 Allergy status to analgesic agent status: Secondary | ICD-10-CM | POA: Diagnosis not present

## 2022-02-14 LAB — FERRITIN: Ferritin: 161 ng/mL (ref 11–307)

## 2022-02-14 LAB — IRON AND IRON BINDING CAPACITY (CC-WL,HP ONLY)
Iron: 69 ug/dL (ref 28–170)
Saturation Ratios: 20 % (ref 10.4–31.8)
TIBC: 347 ug/dL (ref 250–450)
UIBC: 278 ug/dL (ref 148–442)

## 2022-02-14 NOTE — Progress Notes (Signed)
CBC and Retic count clotted per lab and need re-drawn. Pt left the office before CBC and Retic count could be re-drawn.  Vale Haven NP notified.  Call placed to patient and message left to inform pt that more lab work is needed to be drawn at her convenience and that this office would contact her with iron results.  Instructed pt to call office back with any questions.

## 2022-02-14 NOTE — Progress Notes (Signed)
Hematology and Oncology Follow Up Visit  Bailey Russell [id number] Jan 05, 1966 56 y.o. 02/14/2022   Principle Diagnosis:  Iron deficiency anemia secondary to malabsorption, s/p gastric bypass 2018   Current Therapy:   IV iron as indicated    Interim History:  [name] is here today for follow-up. She is symptomatic with fatigue, SOB, chest pain and palpitations that she states are unchanged from her baseline long term symptoms.  She had hysteroscopy with myosure last week on 6/9. She states that she is still having light blood flow and spotting.  No other blood loss noted. No abnormal bruising or petechiae.  No fever, chills, n/v, rash, abdominal pain or changes in bowel or bladder habits.  She takes fiber and a stool softener for chronic constipation.   She has history of vertigo with dizziness and syncope as well as migraines.  Numbness and tingling in her right foot unchanged since injury years ago.  No swelling noted in her extremities at this time.  No falls to report.  Appetite and hydration are good right now. Weight is stable at 280 lbs.   ECOG Performance Status: 1 - Symptomatic but completely ambulatory  Medications:  Allergies as of 02/14/2022       Reactions   Lisinopril Cough   Ramipril Cough   Venofer [iron Sucrose] Anaphylaxis   Chest pain, migraine, shortness of breath,x2 days after venofer infusion.   Naproxen Nausea Only        Medication List        Accurate as of February 14, 2022  8:48 AM. If you have any questions, ask your nurse or doctor.          acetaminophen 500 MG tablet Commonly known as: TYLENOL Take 1,000 mg by mouth every 6 (six) hours as needed.   ALBUTEROL SULFATE HFA IN Inhale 2 puffs into the lungs every 6 (six) hours as needed.   cloNIDine 0.2 MG tablet Commonly known as: CATAPRES Take 0.1 mg by mouth at bedtime.   cloNIDine 0.2 MG tablet Commonly known as: CATAPRES Take 1 tablet by mouth at bedtime.    cyclobenzaprine 10 MG tablet Commonly known as: FLEXERIL Take 1 tablet (10 mg total) by mouth 2 (two) times daily as needed for muscle spasms.   doxepin 100 MG capsule Commonly known as: SINEQUAN Take 100 mg by mouth at bedtime.   Emgality 120 MG/ML Soaj Generic drug: Galcanezumab-gnlm Inject 120 mg into the skin every 30 (thirty) days.   FLUoxetine 40 MG capsule Commonly known as: PROZAC Take 40 mg by mouth daily.   hydrOXYzine 25 MG tablet Commonly known as: ATARAX Take 1 tablet by mouth as needed.   meloxicam 15 MG tablet Commonly known as: MOBIC Take 1 tablet by mouth daily.   ondansetron 4 MG tablet Commonly known as: ZOFRAN Take 2 tablets by mouth daily as needed.   QUEtiapine 300 MG 24 hr tablet Commonly known as: SEROQUEL XR Take 1 tablet by mouth in the morning and at bedtime.   topiramate 200 MG tablet Commonly known as: TOPAMAX Take 200 mg by mouth at bedtime and may repeat dose one time if needed.   Ubrelvy 100 MG Tabs Generic drug: Ubrogepant Take 1 tablet by mouth as needed.   Vitamin D (Ergocalciferol) 1.25 MG (50000 UNIT) Caps capsule Commonly known as: DRISDOL Take 50,000 Units by mouth 2 (two) times daily.        Allergies:  Allergies  Allergen Reactions   Lisinopril Cough   Ramipril  Cough   Venofer [Iron Sucrose] Anaphylaxis    Chest pain, migraine, shortness of breath,x2 days after venofer infusion.   Naproxen Nausea Only    Past Medical History, Surgical history, Social history, and Family History were reviewed and updated.  Review of Systems: All other 10 point review of systems is negative.   Physical Exam:  weight is 280 lb 12.8 oz (127.4 kg). Her oral temperature is 98.4 F (36.9 C). Her blood pressure is 128/87 and her pulse is 104 (abnormal). Her respiration is 17 and oxygen saturation is 99%.   Wt Readings from Last 3 Encounters:  02/14/22 280 lb 12.8 oz (127.4 kg)  12/16/21 283 lb 1.3 oz (128.4 kg)  04/14/20 275 lb  (124.7 kg)    Ocular: Sclerae unicteric, pupils equal, round and reactive to light Ear-nose-throat: Oropharynx clear, dentition fair Lymphatic: No cervical or supraclavicular adenopathy Lungs no rales or rhonchi, good excursion bilaterally Heart regular rate and rhythm, no murmur appreciated Abd soft, nontender, positive bowel sounds MSK no focal spinal tenderness, no joint edema Neuro: non-focal, well-oriented, appropriate affect Breasts: Deferred   Lab Results  Component Value Date   WBC 7.4 12/16/2021   HGB 11.1 (L) 12/16/2021   HCT 35.8 (L) 12/16/2021   MCV 87.3 12/16/2021   PLT 325 12/16/2021   No results found for: "FERRITIN", "IRON", "TIBC", "UIBC", "IRONPCTSAT" Lab Results  Component Value Date   RETICCTPCT 1.5 12/16/2021   RBC 4.13 12/16/2021   RBC 4.10 12/16/2021   No results found for: "KPAFRELGTCHN", "LAMBDASER", "KAPLAMBRATIO" No results found for: "IGGSERUM", "IGA", "IGMSERUM" No results found for: "TOTALPROTELP", "ALBUMINELP", "A1GS", "A2GS", "BETS", "BETA2SER", "GAMS", "MSPIKE", "SPEI"   Chemistry      Component Value Date/Time   NA 142 03/16/2020 0910   K 4.1 03/16/2020 0910   CL 101 03/16/2020 0910   CO2 27 03/16/2020 0910   BUN 9 03/16/2020 0910   CREATININE 1.03 (H) 03/16/2020 0910      Component Value Date/Time   CALCIUM 9.6 03/16/2020 0910   ALKPHOS 65 11/09/2013 0955   AST 19 11/09/2013 0955   ALT 17 11/09/2013 0955   BILITOT 0.3 11/09/2013 0955       Impression and Plan: [name] is a very pleasant 56 yo African American female with iron deficiency anemia secondary to malabsorption s/p gastric bypass in 2018. Iron studies are pending.  Follow-up in 3 months.   Lottie Dawson, NP 6/13/20238:48 AM

## 2022-02-15 DIAGNOSIS — G9389 Other specified disorders of brain: Secondary | ICD-10-CM | POA: Diagnosis not present

## 2022-02-16 DIAGNOSIS — K219 Gastro-esophageal reflux disease without esophagitis: Secondary | ICD-10-CM | POA: Diagnosis not present

## 2022-02-16 DIAGNOSIS — F17210 Nicotine dependence, cigarettes, uncomplicated: Secondary | ICD-10-CM | POA: Diagnosis not present

## 2022-02-16 DIAGNOSIS — Z9884 Bariatric surgery status: Secondary | ICD-10-CM | POA: Diagnosis not present

## 2022-02-17 ENCOUNTER — Telehealth: Payer: Self-pay | Admitting: *Deleted

## 2022-02-17 NOTE — Telephone Encounter (Signed)
Per 02/14/22 los - called and lvm of upcoming appointments - requested call back to confirm

## 2022-02-21 DIAGNOSIS — I10 Essential (primary) hypertension: Secondary | ICD-10-CM | POA: Diagnosis not present

## 2022-02-21 DIAGNOSIS — D508 Other iron deficiency anemias: Secondary | ICD-10-CM | POA: Diagnosis not present

## 2022-02-21 DIAGNOSIS — E669 Obesity, unspecified: Secondary | ICD-10-CM | POA: Diagnosis not present

## 2022-02-21 DIAGNOSIS — E7211 Homocystinuria: Secondary | ICD-10-CM | POA: Diagnosis not present

## 2022-02-21 DIAGNOSIS — J4520 Mild intermittent asthma, uncomplicated: Secondary | ICD-10-CM | POA: Diagnosis not present

## 2022-02-21 DIAGNOSIS — Z0001 Encounter for general adult medical examination with abnormal findings: Secondary | ICD-10-CM | POA: Diagnosis not present

## 2022-02-21 DIAGNOSIS — G43009 Migraine without aura, not intractable, without status migrainosus: Secondary | ICD-10-CM | POA: Diagnosis not present

## 2022-02-21 DIAGNOSIS — E782 Mixed hyperlipidemia: Secondary | ICD-10-CM | POA: Diagnosis not present

## 2022-02-21 DIAGNOSIS — Z720 Tobacco use: Secondary | ICD-10-CM | POA: Diagnosis not present

## 2022-02-21 DIAGNOSIS — E559 Vitamin D deficiency, unspecified: Secondary | ICD-10-CM | POA: Diagnosis not present

## 2022-02-21 DIAGNOSIS — R7309 Other abnormal glucose: Secondary | ICD-10-CM | POA: Diagnosis not present

## 2022-03-02 DIAGNOSIS — N950 Postmenopausal bleeding: Secondary | ICD-10-CM | POA: Diagnosis not present

## 2022-03-02 DIAGNOSIS — Z09 Encounter for follow-up examination after completed treatment for conditions other than malignant neoplasm: Secondary | ICD-10-CM | POA: Diagnosis not present

## 2022-03-06 DIAGNOSIS — F314 Bipolar disorder, current episode depressed, severe, without psychotic features: Secondary | ICD-10-CM | POA: Diagnosis not present

## 2022-03-09 DIAGNOSIS — Z0001 Encounter for general adult medical examination with abnormal findings: Secondary | ICD-10-CM | POA: Diagnosis not present

## 2022-03-09 DIAGNOSIS — E669 Obesity, unspecified: Secondary | ICD-10-CM | POA: Diagnosis not present

## 2022-03-09 DIAGNOSIS — J4520 Mild intermittent asthma, uncomplicated: Secondary | ICD-10-CM | POA: Diagnosis not present

## 2022-03-09 DIAGNOSIS — D508 Other iron deficiency anemias: Secondary | ICD-10-CM | POA: Diagnosis not present

## 2022-03-09 DIAGNOSIS — E559 Vitamin D deficiency, unspecified: Secondary | ICD-10-CM | POA: Diagnosis not present

## 2022-03-09 DIAGNOSIS — Z720 Tobacco use: Secondary | ICD-10-CM | POA: Diagnosis not present

## 2022-03-09 DIAGNOSIS — E7211 Homocystinuria: Secondary | ICD-10-CM | POA: Diagnosis not present

## 2022-03-09 DIAGNOSIS — E782 Mixed hyperlipidemia: Secondary | ICD-10-CM | POA: Diagnosis not present

## 2022-03-10 DIAGNOSIS — I10 Essential (primary) hypertension: Secondary | ICD-10-CM | POA: Diagnosis not present

## 2022-03-22 DIAGNOSIS — M7122 Synovial cyst of popliteal space [Baker], left knee: Secondary | ICD-10-CM | POA: Diagnosis not present

## 2022-03-22 DIAGNOSIS — Z9109 Other allergy status, other than to drugs and biological substances: Secondary | ICD-10-CM | POA: Diagnosis not present

## 2022-03-22 DIAGNOSIS — Z888 Allergy status to other drugs, medicaments and biological substances status: Secondary | ICD-10-CM | POA: Diagnosis not present

## 2022-03-22 DIAGNOSIS — M25562 Pain in left knee: Secondary | ICD-10-CM | POA: Diagnosis not present

## 2022-03-22 DIAGNOSIS — M76892 Other specified enthesopathies of left lower limb, excluding foot: Secondary | ICD-10-CM | POA: Diagnosis not present

## 2022-03-22 DIAGNOSIS — Z91030 Bee allergy status: Secondary | ICD-10-CM | POA: Diagnosis not present

## 2022-03-22 DIAGNOSIS — M7989 Other specified soft tissue disorders: Secondary | ICD-10-CM | POA: Diagnosis not present

## 2022-03-22 DIAGNOSIS — Z87891 Personal history of nicotine dependence: Secondary | ICD-10-CM | POA: Diagnosis not present

## 2022-03-27 DIAGNOSIS — M25571 Pain in right ankle and joints of right foot: Secondary | ICD-10-CM | POA: Diagnosis not present

## 2022-03-30 DIAGNOSIS — H40013 Open angle with borderline findings, low risk, bilateral: Secondary | ICD-10-CM | POA: Diagnosis not present

## 2022-03-30 DIAGNOSIS — R51 Headache with orthostatic component, not elsewhere classified: Secondary | ICD-10-CM | POA: Diagnosis not present

## 2022-03-30 DIAGNOSIS — G43909 Migraine, unspecified, not intractable, without status migrainosus: Secondary | ICD-10-CM | POA: Diagnosis not present

## 2022-03-31 DIAGNOSIS — M25562 Pain in left knee: Secondary | ICD-10-CM | POA: Diagnosis not present

## 2022-04-03 DIAGNOSIS — M7122 Synovial cyst of popliteal space [Baker], left knee: Secondary | ICD-10-CM | POA: Diagnosis not present

## 2022-04-05 DIAGNOSIS — D329 Benign neoplasm of meninges, unspecified: Secondary | ICD-10-CM | POA: Diagnosis not present

## 2022-04-05 DIAGNOSIS — R9389 Abnormal findings on diagnostic imaging of other specified body structures: Secondary | ICD-10-CM | POA: Diagnosis not present

## 2022-04-05 DIAGNOSIS — G4459 Other complicated headache syndrome: Secondary | ICD-10-CM | POA: Diagnosis not present

## 2022-04-10 DIAGNOSIS — I10 Essential (primary) hypertension: Secondary | ICD-10-CM | POA: Diagnosis not present

## 2022-04-10 DIAGNOSIS — M1712 Unilateral primary osteoarthritis, left knee: Secondary | ICD-10-CM | POA: Diagnosis not present

## 2022-04-17 DIAGNOSIS — M1712 Unilateral primary osteoarthritis, left knee: Secondary | ICD-10-CM | POA: Diagnosis not present

## 2022-04-18 ENCOUNTER — Other Ambulatory Visit: Payer: Self-pay | Admitting: Orthopedic Surgery

## 2022-04-18 DIAGNOSIS — M7122 Synovial cyst of popliteal space [Baker], left knee: Secondary | ICD-10-CM

## 2022-04-21 ENCOUNTER — Ambulatory Visit
Admission: RE | Admit: 2022-04-21 | Discharge: 2022-04-21 | Disposition: A | Discharge status: Home / Self Care | Payer: Medicare HMO | Source: Ambulatory Visit | Attending: Orthopedic Surgery | Admitting: Orthopedic Surgery

## 2022-04-21 DIAGNOSIS — M7122 Synovial cyst of popliteal space [Baker], left knee: Secondary | ICD-10-CM | POA: Diagnosis not present

## 2022-04-24 DIAGNOSIS — M1712 Unilateral primary osteoarthritis, left knee: Secondary | ICD-10-CM | POA: Diagnosis not present

## 2022-05-10 DIAGNOSIS — I10 Essential (primary) hypertension: Secondary | ICD-10-CM | POA: Diagnosis not present

## 2022-05-17 ENCOUNTER — Inpatient Hospital Stay (HOSPITAL_BASED_OUTPATIENT_CLINIC_OR_DEPARTMENT_OTHER): Payer: Medicare HMO | Admitting: Family

## 2022-05-17 ENCOUNTER — Other Ambulatory Visit: Payer: Self-pay

## 2022-05-17 ENCOUNTER — Encounter: Payer: Self-pay | Admitting: Family

## 2022-05-17 ENCOUNTER — Inpatient Hospital Stay: Payer: Medicare HMO | Attending: Hematology & Oncology

## 2022-05-17 VITALS — BP 143/85 | HR 72 | Temp 98.4°F | Resp 18 | Ht 68.0 in | Wt 277.8 lb

## 2022-05-17 DIAGNOSIS — K909 Intestinal malabsorption, unspecified: Secondary | ICD-10-CM | POA: Diagnosis not present

## 2022-05-17 DIAGNOSIS — D508 Other iron deficiency anemias: Secondary | ICD-10-CM | POA: Insufficient documentation

## 2022-05-17 DIAGNOSIS — Z886 Allergy status to analgesic agent status: Secondary | ICD-10-CM | POA: Insufficient documentation

## 2022-05-17 DIAGNOSIS — R5383 Other fatigue: Secondary | ICD-10-CM | POA: Insufficient documentation

## 2022-05-17 DIAGNOSIS — D509 Iron deficiency anemia, unspecified: Secondary | ICD-10-CM

## 2022-05-17 DIAGNOSIS — Z79899 Other long term (current) drug therapy: Secondary | ICD-10-CM | POA: Diagnosis not present

## 2022-05-17 DIAGNOSIS — Z888 Allergy status to other drugs, medicaments and biological substances status: Secondary | ICD-10-CM | POA: Diagnosis not present

## 2022-05-17 DIAGNOSIS — Z9884 Bariatric surgery status: Secondary | ICD-10-CM | POA: Diagnosis not present

## 2022-05-17 LAB — CBC WITH DIFFERENTIAL (CANCER CENTER ONLY)
Abs Immature Granulocytes: 0.04 10*3/uL (ref 0.00–0.07)
Basophils Absolute: 0.1 10*3/uL (ref 0.0–0.1)
Basophils Relative: 1 %
Eosinophils Absolute: 0.1 10*3/uL (ref 0.0–0.5)
Eosinophils Relative: 1 %
HCT: 41.1 % (ref 36.0–46.0)
Hemoglobin: 13.3 g/dL (ref 12.0–15.0)
Immature Granulocytes: 1 %
Lymphocytes Relative: 46 %
Lymphs Abs: 3.6 10*3/uL (ref 0.7–4.0)
MCH: 31.6 pg (ref 26.0–34.0)
MCHC: 32.4 g/dL (ref 30.0–36.0)
MCV: 97.6 fL (ref 80.0–100.0)
Monocytes Absolute: 0.5 10*3/uL (ref 0.1–1.0)
Monocytes Relative: 7 %
Neutro Abs: 3.4 10*3/uL (ref 1.7–7.7)
Neutrophils Relative %: 44 %
Platelet Count: 271 10*3/uL (ref 150–400)
RBC: 4.21 MIL/uL (ref 3.87–5.11)
RDW: 13.6 % (ref 11.5–15.5)
WBC Count: 7.8 10*3/uL (ref 4.0–10.5)
nRBC: 0 % (ref 0.0–0.2)

## 2022-05-17 LAB — IRON AND IRON BINDING CAPACITY (CC-WL,HP ONLY)
Iron: 57 ug/dL (ref 28–170)
Saturation Ratios: 17 % (ref 10.4–31.8)
TIBC: 346 ug/dL (ref 250–450)
UIBC: 289 ug/dL (ref 148–442)

## 2022-05-17 LAB — FERRITIN: Ferritin: 68 ng/mL (ref 11–307)

## 2022-05-17 LAB — RETICULOCYTES
Immature Retic Fract: 15.5 % (ref 2.3–15.9)
RBC.: 4.17 MIL/uL (ref 3.87–5.11)
Retic Count, Absolute: 75.9 10*3/uL (ref 19.0–186.0)
Retic Ct Pct: 1.8 % (ref 0.4–3.1)

## 2022-05-17 NOTE — Progress Notes (Signed)
Hematology and Oncology Follow Up Visit  Bailey Russell [id number] 1966/05/06 56 y.o. 05/17/2022   Principle Diagnosis:  Iron deficiency anemia secondary to malabsorption, s/p gastric bypass 2018    Current Therapy:        IV iron as indicated    Interim History:  Bailey Russell is here today for follow-up. She notes fatigue and states that she had some teeth pulled earlier this week. She feels that the pain medication is what is making her feel sleepy.  Her cycle is regular and can be heavy at times. She has a follow-up with gynecology coming up.  No other blood loss noted. No bruising or petechiae.  No fever, chills, n/v, cough, rash, dizziness, SOB, chest pain, palpitations, abdominal pain or changes in bowel or bladder habits at this time.  No swelling or tenderness in her extremities.  No falls or syncope.  Appetite and hydration are good. Weight is stable at 277 lbs.   ECOG Performance Status: 1 - Symptomatic but completely ambulatory  Medications:  Allergies as of 05/17/2022       Reactions   Lisinopril Cough   Ramipril Cough   Venofer [iron Sucrose] Anaphylaxis   Chest pain, migraine, shortness of breath,x2 days after venofer infusion.   Naproxen Nausea Only        Medication List        Accurate as of May 17, 2022 10:41 AM. If you have any questions, ask your nurse or doctor.          STOP taking these medications    cloNIDine 0.2 MG tablet Commonly known as: CATAPRES Stopped by: Lottie Dawson, NP       TAKE these medications    acetaminophen 500 MG tablet Commonly known as: TYLENOL Take 1,000 mg by mouth every 6 (six) hours as needed.   ALBUTEROL SULFATE HFA IN Inhale 2 puffs into the lungs every 6 (six) hours as needed.   cyclobenzaprine 10 MG tablet Commonly known as: FLEXERIL Take 1 tablet (10 mg total) by mouth 2 (two) times daily as needed for muscle spasms.   doxepin 100 MG capsule Commonly known as: SINEQUAN Take  100 mg by mouth at bedtime.   Emgality 120 MG/ML Soaj Generic drug: Galcanezumab-gnlm Inject 120 mg into the skin every 30 (thirty) days.   FLUoxetine 40 MG capsule Commonly known as: PROZAC Take 40 mg by mouth daily.   hydrOXYzine 25 MG tablet Commonly known as: ATARAX Take 1 tablet by mouth as needed.   meloxicam 15 MG tablet Commonly known as: MOBIC Take 1 tablet by mouth daily.   ondansetron 4 MG tablet Commonly known as: ZOFRAN Take 2 tablets by mouth daily as needed.   QUEtiapine 300 MG 24 hr tablet Commonly known as: SEROQUEL XR Take 1 tablet by mouth in the morning and at bedtime.   topiramate 200 MG tablet Commonly known as: TOPAMAX Take 200 mg by mouth at bedtime and may repeat dose one time if needed.   Ubrelvy 100 MG Tabs Generic drug: Ubrogepant Take 1 tablet by mouth as needed.   Vitamin D (Ergocalciferol) 1.25 MG (50000 UNIT) Caps capsule Commonly known as: DRISDOL Take 50,000 Units by mouth 2 (two) times daily.        Allergies:  Allergies  Allergen Reactions   Lisinopril Cough   Ramipril Cough   Venofer [Iron Sucrose] Anaphylaxis    Chest pain, migraine, shortness of breath,x2 days after venofer infusion.   Naproxen Nausea Only    Past  Medical History, Surgical history, Social history, and Family History were reviewed and updated.  Review of Systems: All other 10 point review of systems is negative.   Physical Exam:  vitals were not taken for this visit.   Wt Readings from Last 3 Encounters:  02/14/22 280 lb 12.8 oz (127.4 kg)  12/16/21 283 lb 1.3 oz (128.4 kg)  04/14/20 275 lb (124.7 kg)    Ocular: Sclerae unicteric, pupils equal, round and reactive to light Ear-nose-throat: Oropharynx clear, dentition fair Lymphatic: No cervical or supraclavicular adenopathy Lungs no rales or rhonchi, good excursion bilaterally Heart regular rate and rhythm, no murmur appreciated Abd soft, nontender, positive bowel sounds MSK no focal spinal  tenderness, no joint edema Neuro: non-focal, well-oriented, appropriate affect Breasts: Deferred   Lab Results  Component Value Date   WBC 7.8 05/17/2022   HGB 13.3 05/17/2022   HCT 41.1 05/17/2022   MCV 97.6 05/17/2022   PLT 271 05/17/2022   Lab Results  Component Value Date   FERRITIN 161 02/14/2022   IRON 69 02/14/2022   TIBC 347 02/14/2022   UIBC 278 02/14/2022   IRONPCTSAT 20 02/14/2022   Lab Results  Component Value Date   RETICCTPCT 1.8 05/17/2022   RBC 4.21 05/17/2022   No results found for: "KPAFRELGTCHN", "LAMBDASER", "KAPLAMBRATIO" No results found for: "IGGSERUM", "IGA", "IGMSERUM" No results found for: "TOTALPROTELP", "ALBUMINELP", "A1GS", "A2GS", "BETS", "BETA2SER", "GAMS", "MSPIKE", "SPEI"   Chemistry      Component Value Date/Time   NA 142 03/16/2020 0910   K 4.1 03/16/2020 0910   CL 101 03/16/2020 0910   CO2 27 03/16/2020 0910   BUN 9 03/16/2020 0910   CREATININE 1.03 (H) 03/16/2020 0910      Component Value Date/Time   CALCIUM 9.6 03/16/2020 0910   ALKPHOS 65 11/09/2013 0955   AST 19 11/09/2013 0955   ALT 17 11/09/2013 0955   BILITOT 0.3 11/09/2013 0955       Impression and Plan: Bailey Russell is a very pleasant 56 yo African American female with iron deficiency anemia secondary to malabsorption s/p gastric bypass in 2018. Iron studies are pending.  Follow-up in 4 months.   Lottie Dawson, NP 9/13/202310:41 AM

## 2022-05-22 DIAGNOSIS — I10 Essential (primary) hypertension: Secondary | ICD-10-CM | POA: Diagnosis not present

## 2022-05-22 DIAGNOSIS — N950 Postmenopausal bleeding: Secondary | ICD-10-CM | POA: Diagnosis not present

## 2022-05-24 ENCOUNTER — Telehealth: Payer: Self-pay | Admitting: *Deleted

## 2022-05-24 DIAGNOSIS — F314 Bipolar disorder, current episode depressed, severe, without psychotic features: Secondary | ICD-10-CM | POA: Diagnosis not present

## 2022-05-24 NOTE — Telephone Encounter (Signed)
Per 05/17/22 los - called and lvm of upcoming appointment - requested callback to confirm

## 2022-05-25 DIAGNOSIS — N950 Postmenopausal bleeding: Secondary | ICD-10-CM | POA: Diagnosis not present

## 2022-06-09 DIAGNOSIS — I10 Essential (primary) hypertension: Secondary | ICD-10-CM | POA: Diagnosis not present

## 2022-06-16 DIAGNOSIS — G4730 Sleep apnea, unspecified: Secondary | ICD-10-CM | POA: Diagnosis not present

## 2022-06-16 DIAGNOSIS — G4733 Obstructive sleep apnea (adult) (pediatric): Secondary | ICD-10-CM | POA: Diagnosis not present

## 2022-06-16 DIAGNOSIS — G43909 Migraine, unspecified, not intractable, without status migrainosus: Secondary | ICD-10-CM | POA: Diagnosis not present

## 2022-06-16 DIAGNOSIS — K219 Gastro-esophageal reflux disease without esophagitis: Secondary | ICD-10-CM | POA: Diagnosis not present

## 2022-06-16 DIAGNOSIS — N736 Female pelvic peritoneal adhesions (postinfective): Secondary | ICD-10-CM | POA: Diagnosis not present

## 2022-06-16 DIAGNOSIS — Z79899 Other long term (current) drug therapy: Secondary | ICD-10-CM | POA: Diagnosis not present

## 2022-06-16 DIAGNOSIS — I10 Essential (primary) hypertension: Secondary | ICD-10-CM | POA: Diagnosis not present

## 2022-06-16 DIAGNOSIS — I252 Old myocardial infarction: Secondary | ICD-10-CM | POA: Diagnosis not present

## 2022-06-16 DIAGNOSIS — F319 Bipolar disorder, unspecified: Secondary | ICD-10-CM | POA: Diagnosis not present

## 2022-06-16 DIAGNOSIS — I2510 Atherosclerotic heart disease of native coronary artery without angina pectoris: Secondary | ICD-10-CM | POA: Diagnosis not present

## 2022-06-16 DIAGNOSIS — N840 Polyp of corpus uteri: Secondary | ICD-10-CM | POA: Diagnosis not present

## 2022-06-16 DIAGNOSIS — N950 Postmenopausal bleeding: Secondary | ICD-10-CM | POA: Diagnosis not present

## 2022-06-16 DIAGNOSIS — Z8673 Personal history of transient ischemic attack (TIA), and cerebral infarction without residual deficits: Secondary | ICD-10-CM | POA: Diagnosis not present

## 2022-06-20 DIAGNOSIS — E559 Vitamin D deficiency, unspecified: Secondary | ICD-10-CM | POA: Diagnosis not present

## 2022-06-20 DIAGNOSIS — G43719 Chronic migraine without aura, intractable, without status migrainosus: Secondary | ICD-10-CM | POA: Diagnosis not present

## 2022-06-20 DIAGNOSIS — E7211 Homocystinuria: Secondary | ICD-10-CM | POA: Diagnosis not present

## 2022-06-20 DIAGNOSIS — I10 Essential (primary) hypertension: Secondary | ICD-10-CM | POA: Diagnosis not present

## 2022-06-20 DIAGNOSIS — Z0001 Encounter for general adult medical examination with abnormal findings: Secondary | ICD-10-CM | POA: Diagnosis not present

## 2022-06-20 DIAGNOSIS — D508 Other iron deficiency anemias: Secondary | ICD-10-CM | POA: Diagnosis not present

## 2022-06-20 DIAGNOSIS — E782 Mixed hyperlipidemia: Secondary | ICD-10-CM | POA: Diagnosis not present

## 2022-06-20 DIAGNOSIS — Z720 Tobacco use: Secondary | ICD-10-CM | POA: Diagnosis not present

## 2022-06-20 DIAGNOSIS — E669 Obesity, unspecified: Secondary | ICD-10-CM | POA: Diagnosis not present

## 2022-06-20 DIAGNOSIS — J4520 Mild intermittent asthma, uncomplicated: Secondary | ICD-10-CM | POA: Diagnosis not present

## 2022-06-21 ENCOUNTER — Telehealth: Payer: Self-pay

## 2022-06-21 NOTE — Telephone Encounter (Signed)
Spoke with patient last month and she asked about possible program graduation. Patient has been enrolled in [employer] since 03/2020. Please advise.  BP Summaries: Average Systolic BP Level 220.25 mmHg Lowest Systolic BP Level 427 mmHg Highest Systolic BP Level 062 mmHg  Average Diastolic BP Level 37.62 mmHg Lowest Diastolic BP Level 60 mmHg Highest Diastolic BP Level 831 mmHg  BP Readings: 06/21/2022 Wednesday at 07:27 AM 128 / 76      06/20/2022 Tuesday at 06:27 AM 121 / 67      06/19/2022 Monday at 06:59 AM 155 / 108      06/18/2022 'Sunday at 05:38 AM 144 / 84      06/17/2022 Saturday at 07:29 AM 136 / 80      06/15/2022 Thursday at 06:52 AM 112 / 60      06/14/2022 Wednesday at 08:02 AM 138 / 84      06/13/2022 Tuesday at 07:02 AM 132 / 77      06/11/2022 Sunday at 10:35 AM 119 / 73      06/10/2022 Saturday at 12:00 PM 142 / 96      06/08/2022 Thursday at 05:00 AM 129 / 97      06/07/2022 Wednesday at 07:20 AM 143 / 98      06/06/2022 Tuesday at 05:34 AM 141 / 89      10'$ /09/2021 Sunday at 11:09 AM 126 / 90

## 2022-06-22 DIAGNOSIS — E782 Mixed hyperlipidemia: Secondary | ICD-10-CM | POA: Diagnosis not present

## 2022-06-22 DIAGNOSIS — E7211 Homocystinuria: Secondary | ICD-10-CM | POA: Diagnosis not present

## 2022-06-22 DIAGNOSIS — D508 Other iron deficiency anemias: Secondary | ICD-10-CM | POA: Diagnosis not present

## 2022-06-22 DIAGNOSIS — E559 Vitamin D deficiency, unspecified: Secondary | ICD-10-CM | POA: Diagnosis not present

## 2022-06-22 DIAGNOSIS — Z0001 Encounter for general adult medical examination with abnormal findings: Secondary | ICD-10-CM | POA: Diagnosis not present

## 2022-06-22 DIAGNOSIS — J4520 Mild intermittent asthma, uncomplicated: Secondary | ICD-10-CM | POA: Diagnosis not present

## 2022-06-22 DIAGNOSIS — E669 Obesity, unspecified: Secondary | ICD-10-CM | POA: Diagnosis not present

## 2022-06-22 DIAGNOSIS — Z720 Tobacco use: Secondary | ICD-10-CM | POA: Diagnosis not present

## 2022-06-22 NOTE — Telephone Encounter (Signed)
Fairly good.  Thanks MJP

## 2022-06-27 NOTE — Telephone Encounter (Signed)
Okay to graduate.  Thanks MJP

## 2022-06-29 DIAGNOSIS — Z09 Encounter for follow-up examination after completed treatment for conditions other than malignant neoplasm: Secondary | ICD-10-CM | POA: Diagnosis not present

## 2022-06-29 DIAGNOSIS — N950 Postmenopausal bleeding: Secondary | ICD-10-CM | POA: Diagnosis not present

## 2022-07-10 DIAGNOSIS — I10 Essential (primary) hypertension: Secondary | ICD-10-CM | POA: Diagnosis not present

## 2022-09-18 ENCOUNTER — Inpatient Hospital Stay: Payer: Medicare HMO | Attending: Hematology & Oncology

## 2022-09-18 ENCOUNTER — Inpatient Hospital Stay (HOSPITAL_BASED_OUTPATIENT_CLINIC_OR_DEPARTMENT_OTHER): Payer: Medicare HMO | Admitting: Family

## 2022-09-18 ENCOUNTER — Encounter: Payer: Self-pay | Admitting: Family

## 2022-09-18 VITALS — BP 143/93 | HR 87 | Temp 98.6°F | Resp 17 | Wt 280.1 lb

## 2022-09-18 DIAGNOSIS — D508 Other iron deficiency anemias: Secondary | ICD-10-CM | POA: Diagnosis not present

## 2022-09-18 DIAGNOSIS — D509 Iron deficiency anemia, unspecified: Secondary | ICD-10-CM | POA: Diagnosis not present

## 2022-09-18 DIAGNOSIS — K909 Intestinal malabsorption, unspecified: Secondary | ICD-10-CM | POA: Diagnosis not present

## 2022-09-18 DIAGNOSIS — Z888 Allergy status to other drugs, medicaments and biological substances status: Secondary | ICD-10-CM | POA: Diagnosis not present

## 2022-09-18 DIAGNOSIS — Z886 Allergy status to analgesic agent status: Secondary | ICD-10-CM | POA: Diagnosis not present

## 2022-09-18 DIAGNOSIS — R5383 Other fatigue: Secondary | ICD-10-CM | POA: Diagnosis not present

## 2022-09-18 DIAGNOSIS — Z79899 Other long term (current) drug therapy: Secondary | ICD-10-CM | POA: Insufficient documentation

## 2022-09-18 DIAGNOSIS — Z9884 Bariatric surgery status: Secondary | ICD-10-CM | POA: Insufficient documentation

## 2022-09-18 LAB — RETICULOCYTES
Immature Retic Fract: 10.8 % (ref 2.3–15.9)
RBC.: 4.55 MIL/uL (ref 3.87–5.11)
Retic Count, Absolute: 71.9 10*3/uL (ref 19.0–186.0)
Retic Ct Pct: 1.6 % (ref 0.4–3.1)

## 2022-09-18 LAB — CBC WITH DIFFERENTIAL (CANCER CENTER ONLY)
Abs Immature Granulocytes: 0.01 10*3/uL (ref 0.00–0.07)
Basophils Absolute: 0.1 10*3/uL (ref 0.0–0.1)
Basophils Relative: 1 %
Eosinophils Absolute: 0.1 10*3/uL (ref 0.0–0.5)
Eosinophils Relative: 2 %
HCT: 45.2 % (ref 36.0–46.0)
Hemoglobin: 14.6 g/dL (ref 12.0–15.0)
Immature Granulocytes: 0 %
Lymphocytes Relative: 44 %
Lymphs Abs: 2.9 10*3/uL (ref 0.7–4.0)
MCH: 31.8 pg (ref 26.0–34.0)
MCHC: 32.3 g/dL (ref 30.0–36.0)
MCV: 98.5 fL (ref 80.0–100.0)
Monocytes Absolute: 0.5 10*3/uL (ref 0.1–1.0)
Monocytes Relative: 8 %
Neutro Abs: 2.8 10*3/uL (ref 1.7–7.7)
Neutrophils Relative %: 45 %
Platelet Count: 278 10*3/uL (ref 150–400)
RBC: 4.59 MIL/uL (ref 3.87–5.11)
RDW: 12.1 % (ref 11.5–15.5)
WBC Count: 6.4 10*3/uL (ref 4.0–10.5)
nRBC: 0 % (ref 0.0–0.2)

## 2022-09-18 LAB — IRON AND IRON BINDING CAPACITY (CC-WL,HP ONLY)
Iron: 85 ug/dL (ref 28–170)
Saturation Ratios: 23 % (ref 10.4–31.8)
TIBC: 364 ug/dL (ref 250–450)
UIBC: 279 ug/dL (ref 148–442)

## 2022-09-18 LAB — FERRITIN: Ferritin: 67 ng/mL (ref 11–307)

## 2022-09-18 NOTE — Progress Notes (Signed)
Hematology and Oncology Follow Up Visit  Bailey Russell [id number] 10-Nov-1965 57 y.o. 09/18/2022   Principle Diagnosis:  Iron deficiency anemia secondary to malabsorption, s/p gastric bypass 2018    Current Therapy:        IV iron as indicated    Interim History:  Bailey Russell is here today for follow-up. She is feeling fatigued.  She has not noted any abnormal blood loss. Her cycle since her polypectomy has been light.  No bruising or petechiae.  She states that her gynecologist plans to schedule her for hysterectomy this spring.  No fever, chills, n/v, cough, rash, dizziness, SOB, chest pain, palpitations, abdominal pain or changes in bowel or bladder habits.  No swelling or tenderness in her extremities.  She has stiffness in her fingers and toes in the mornings. She states that taking an antiinflammatory helps.  No falls or syncope.  Appetite and hydration are good. Weight is stable at 280 lbs.   ECOG Performance Status: 1 - Symptomatic but completely ambulatory  Medications:  Allergies as of 09/18/2022       Reactions   Lisinopril Cough   Ramipril Cough   Venofer [iron Sucrose] Anaphylaxis   Chest pain, migraine, shortness of breath,x2 days after venofer infusion.   Naproxen Nausea Only        Medication List        Accurate as of September 18, 2022 10:59 AM. If you have any questions, ask your nurse or doctor.          acetaminophen 500 MG tablet Commonly known as: TYLENOL Take 1,000 mg by mouth every 6 (six) hours as needed.   ALBUTEROL SULFATE HFA IN Inhale 2 puffs into the lungs every 6 (six) hours as needed.   amoxicillin 500 MG capsule Commonly known as: AMOXIL Take 500 mg by mouth 3 (three) times daily.   baclofen 10 MG tablet Commonly known as: LIORESAL TAKE 1-2 TABS BY MOUTH EVERY 6-8 HOURS AS NEEDED FOR PAIN   busPIRone 7.5 MG tablet Commonly known as: BUSPAR Take 7.5 mg by mouth 3 (three) times daily.   cyclobenzaprine 10  MG tablet Commonly known as: FLEXERIL Take 1 tablet (10 mg total) by mouth 2 (two) times daily as needed for muscle spasms.   doxepin 100 MG capsule Commonly known as: SINEQUAN Take 100 mg by mouth at bedtime.   esomeprazole 40 MG capsule Commonly known as: NEXIUM Take 40 mg by mouth 2 (two) times daily.   famotidine 40 MG tablet Commonly known as: PEPCID Take 40 mg by mouth 2 (two) times daily.   ferrous sulfate 325 (65 FE) MG EC tablet Take 325 mg by mouth 3 (three) times daily with meals.   FLUoxetine 40 MG capsule Commonly known as: PROZAC Take 40 mg by mouth daily.   folic acid 1 MG tablet Commonly known as: FOLVITE Take 1 mg by mouth daily.   HYDROcodone-acetaminophen 5-325 MG tablet Commonly known as: NORCO/VICODIN Take 1 tablet by mouth every 4 (four) hours as needed.   hydrOXYzine 25 MG tablet Commonly known as: ATARAX Take 1 tablet by mouth as needed.   ibuprofen 600 MG tablet Commonly known as: ADVIL Take by mouth.   meloxicam 15 MG tablet Commonly known as: MOBIC Take 1 tablet by mouth daily.   naloxone 4 MG/0.1ML Liqd nasal spray kit Commonly known as: NARCAN Place 1 spray into the nose.   ondansetron 4 MG tablet Commonly known as: ZOFRAN Take 2 tablets by mouth daily as needed.  pantoprazole 40 MG tablet Commonly known as: PROTONIX Take 40 mg by mouth daily.   phentermine 37.5 MG tablet Commonly known as: ADIPEX-P Take 37.5 mg by mouth daily.   QUEtiapine 300 MG 24 hr tablet Commonly known as: SEROQUEL XR Take 1 tablet by mouth in the morning and at bedtime.   topiramate 200 MG tablet Commonly known as: TOPAMAX Take 200 mg by mouth at bedtime and may repeat dose one time if needed.   Vitamin D (Ergocalciferol) 1.25 MG (50000 UNIT) Caps capsule Commonly known as: DRISDOL Take 50,000 Units by mouth 2 (two) times daily.        Allergies:  Allergies  Allergen Reactions   Lisinopril Cough   Ramipril Cough   Venofer [Iron  Sucrose] Anaphylaxis    Chest pain, migraine, shortness of breath,x2 days after venofer infusion.   Naproxen Nausea Only    Past Medical History, Surgical history, Social history, and Family History were reviewed and updated.  Review of Systems: All other 10 point review of systems is negative.   Physical Exam:  weight is 280 lb 1.9 oz (127.1 kg). Her oral temperature is 98.6 F (37 C). Her blood pressure is 143/93 (abnormal) and her pulse is 87. Her respiration is 17 and oxygen saturation is 96%.   Wt Readings from Last 3 Encounters:  09/18/22 280 lb 1.9 oz (127.1 kg)  05/17/22 277 lb 12.8 oz (126 kg)  02/14/22 280 lb 12.8 oz (127.4 kg)    Ocular: Sclerae unicteric, pupils equal, round and reactive to light Ear-nose-throat: Oropharynx clear, dentition fair Lymphatic: No cervical or supraclavicular adenopathy Lungs no rales or rhonchi, good excursion bilaterally Heart regular rate and rhythm, no murmur appreciated Abd soft, nontender, positive bowel sounds MSK no focal spinal tenderness, no joint edema Neuro: non-focal, well-oriented, appropriate affect Breasts: Deferred   Lab Results  Component Value Date   WBC 6.4 09/18/2022   HGB 14.6 09/18/2022   HCT 45.2 09/18/2022   MCV 98.5 09/18/2022   PLT 278 09/18/2022   Lab Results  Component Value Date   FERRITIN 68 05/17/2022   IRON 57 05/17/2022   TIBC 346 05/17/2022   UIBC 289 05/17/2022   IRONPCTSAT 17 05/17/2022   Lab Results  Component Value Date   RETICCTPCT 1.6 09/18/2022   RBC 4.59 09/18/2022   RBC 4.55 09/18/2022   No results found for: "KPAFRELGTCHN", "LAMBDASER", "KAPLAMBRATIO" No results found for: "IGGSERUM", "IGA", "IGMSERUM" No results found for: "TOTALPROTELP", "ALBUMINELP", "A1GS", "A2GS", "BETS", "BETA2SER", "GAMS", "MSPIKE", "SPEI"   Chemistry      Component Value Date/Time   NA 142 03/16/2020 0910   K 4.1 03/16/2020 0910   CL 101 03/16/2020 0910   CO2 27 03/16/2020 0910   BUN 9 03/16/2020  0910   CREATININE 1.03 (H) 03/16/2020 0910      Component Value Date/Time   CALCIUM 9.6 03/16/2020 0910   ALKPHOS 65 11/09/2013 0955   AST 19 11/09/2013 0955   ALT 17 11/09/2013 0955   BILITOT 0.3 11/09/2013 0955        Impression and Plan: Bailey Russell is a very pleasant 57 yo African American female with iron deficiency anemia secondary to malabsorption s/p gastric bypass in 2018. Iron studies are pending.  Follow-up in 6 months.   Lottie Dawson, NP 1/15/202410:59 AM

## 2022-09-22 DIAGNOSIS — N950 Postmenopausal bleeding: Secondary | ICD-10-CM | POA: Diagnosis not present

## 2022-10-09 DIAGNOSIS — F419 Anxiety disorder, unspecified: Secondary | ICD-10-CM | POA: Diagnosis not present

## 2022-10-09 DIAGNOSIS — F314 Bipolar disorder, current episode depressed, severe, without psychotic features: Secondary | ICD-10-CM | POA: Diagnosis not present

## 2022-10-19 DIAGNOSIS — I252 Old myocardial infarction: Secondary | ICD-10-CM | POA: Diagnosis not present

## 2022-10-19 DIAGNOSIS — I10 Essential (primary) hypertension: Secondary | ICD-10-CM | POA: Diagnosis not present

## 2022-10-19 DIAGNOSIS — Z91030 Bee allergy status: Secondary | ICD-10-CM | POA: Diagnosis not present

## 2022-10-19 DIAGNOSIS — I2510 Atherosclerotic heart disease of native coronary artery without angina pectoris: Secondary | ICD-10-CM | POA: Diagnosis not present

## 2022-10-19 DIAGNOSIS — G4733 Obstructive sleep apnea (adult) (pediatric): Secondary | ICD-10-CM | POA: Diagnosis not present

## 2022-10-19 DIAGNOSIS — G4730 Sleep apnea, unspecified: Secondary | ICD-10-CM | POA: Diagnosis not present

## 2022-10-19 DIAGNOSIS — N950 Postmenopausal bleeding: Secondary | ICD-10-CM | POA: Diagnosis not present

## 2022-10-19 DIAGNOSIS — D259 Leiomyoma of uterus, unspecified: Secondary | ICD-10-CM | POA: Diagnosis not present

## 2022-10-19 DIAGNOSIS — D251 Intramural leiomyoma of uterus: Secondary | ICD-10-CM | POA: Diagnosis not present

## 2022-10-19 DIAGNOSIS — G8918 Other acute postprocedural pain: Secondary | ICD-10-CM | POA: Diagnosis not present

## 2022-10-19 DIAGNOSIS — N72 Inflammatory disease of cervix uteri: Secondary | ICD-10-CM | POA: Diagnosis not present

## 2022-10-19 DIAGNOSIS — Z8673 Personal history of transient ischemic attack (TIA), and cerebral infarction without residual deficits: Secondary | ICD-10-CM | POA: Diagnosis not present

## 2022-10-19 DIAGNOSIS — Z6841 Body Mass Index (BMI) 40.0 and over, adult: Secondary | ICD-10-CM | POA: Diagnosis not present

## 2022-10-20 DIAGNOSIS — Z8673 Personal history of transient ischemic attack (TIA), and cerebral infarction without residual deficits: Secondary | ICD-10-CM | POA: Diagnosis not present

## 2022-10-20 DIAGNOSIS — Z91030 Bee allergy status: Secondary | ICD-10-CM | POA: Diagnosis not present

## 2022-10-20 DIAGNOSIS — I10 Essential (primary) hypertension: Secondary | ICD-10-CM | POA: Diagnosis not present

## 2022-10-20 DIAGNOSIS — Z6841 Body Mass Index (BMI) 40.0 and over, adult: Secondary | ICD-10-CM | POA: Diagnosis not present

## 2022-10-20 DIAGNOSIS — I252 Old myocardial infarction: Secondary | ICD-10-CM | POA: Diagnosis not present

## 2022-10-20 DIAGNOSIS — N950 Postmenopausal bleeding: Secondary | ICD-10-CM | POA: Diagnosis not present

## 2022-10-20 DIAGNOSIS — G4733 Obstructive sleep apnea (adult) (pediatric): Secondary | ICD-10-CM | POA: Diagnosis not present

## 2022-10-20 DIAGNOSIS — D251 Intramural leiomyoma of uterus: Secondary | ICD-10-CM | POA: Diagnosis not present

## 2022-10-22 DIAGNOSIS — Z8673 Personal history of transient ischemic attack (TIA), and cerebral infarction without residual deficits: Secondary | ICD-10-CM | POA: Diagnosis not present

## 2022-10-22 DIAGNOSIS — R112 Nausea with vomiting, unspecified: Secondary | ICD-10-CM | POA: Diagnosis not present

## 2022-10-22 DIAGNOSIS — K5730 Diverticulosis of large intestine without perforation or abscess without bleeding: Secondary | ICD-10-CM | POA: Diagnosis not present

## 2022-10-22 DIAGNOSIS — K5900 Constipation, unspecified: Secondary | ICD-10-CM | POA: Diagnosis not present

## 2022-10-22 DIAGNOSIS — Z9049 Acquired absence of other specified parts of digestive tract: Secondary | ICD-10-CM | POA: Diagnosis not present

## 2022-10-22 DIAGNOSIS — R1033 Periumbilical pain: Secondary | ICD-10-CM | POA: Diagnosis not present

## 2022-10-22 DIAGNOSIS — R2232 Localized swelling, mass and lump, left upper limb: Secondary | ICD-10-CM | POA: Diagnosis not present

## 2022-10-22 DIAGNOSIS — M79602 Pain in left arm: Secondary | ICD-10-CM | POA: Diagnosis not present

## 2022-10-22 DIAGNOSIS — N3289 Other specified disorders of bladder: Secondary | ICD-10-CM | POA: Diagnosis not present

## 2022-10-22 DIAGNOSIS — Z87891 Personal history of nicotine dependence: Secondary | ICD-10-CM | POA: Diagnosis not present

## 2022-10-22 DIAGNOSIS — K760 Fatty (change of) liver, not elsewhere classified: Secondary | ICD-10-CM | POA: Diagnosis not present

## 2022-10-22 DIAGNOSIS — I252 Old myocardial infarction: Secondary | ICD-10-CM | POA: Diagnosis not present

## 2022-10-31 DIAGNOSIS — D320 Benign neoplasm of cerebral meninges: Secondary | ICD-10-CM | POA: Diagnosis not present

## 2022-10-31 DIAGNOSIS — G4459 Other complicated headache syndrome: Secondary | ICD-10-CM | POA: Diagnosis not present

## 2022-11-11 DIAGNOSIS — G8918 Other acute postprocedural pain: Secondary | ICD-10-CM | POA: Diagnosis not present

## 2022-11-11 DIAGNOSIS — Z9889 Other specified postprocedural states: Secondary | ICD-10-CM | POA: Diagnosis not present

## 2022-11-11 DIAGNOSIS — M79602 Pain in left arm: Secondary | ICD-10-CM | POA: Diagnosis not present

## 2022-11-15 DIAGNOSIS — R9082 White matter disease, unspecified: Secondary | ICD-10-CM | POA: Diagnosis not present

## 2022-11-15 DIAGNOSIS — G4459 Other complicated headache syndrome: Secondary | ICD-10-CM | POA: Diagnosis not present

## 2022-11-16 DIAGNOSIS — Z049 Encounter for examination and observation for unspecified reason: Secondary | ICD-10-CM | POA: Diagnosis not present

## 2022-11-16 DIAGNOSIS — G43719 Chronic migraine without aura, intractable, without status migrainosus: Secondary | ICD-10-CM | POA: Diagnosis not present

## 2022-11-17 ENCOUNTER — Other Ambulatory Visit: Payer: Self-pay | Admitting: Specialist

## 2022-11-17 DIAGNOSIS — G932 Benign intracranial hypertension: Secondary | ICD-10-CM

## 2022-11-21 DIAGNOSIS — M542 Cervicalgia: Secondary | ICD-10-CM | POA: Diagnosis not present

## 2022-11-21 DIAGNOSIS — M791 Myalgia, unspecified site: Secondary | ICD-10-CM | POA: Diagnosis not present

## 2022-11-21 DIAGNOSIS — G43719 Chronic migraine without aura, intractable, without status migrainosus: Secondary | ICD-10-CM | POA: Diagnosis not present

## 2022-11-21 DIAGNOSIS — Z79899 Other long term (current) drug therapy: Secondary | ICD-10-CM | POA: Diagnosis not present

## 2022-11-21 DIAGNOSIS — G518 Other disorders of facial nerve: Secondary | ICD-10-CM | POA: Diagnosis not present

## 2022-11-24 ENCOUNTER — Ambulatory Visit
Admission: RE | Admit: 2022-11-24 | Discharge: 2022-11-24 | Disposition: A | Discharge status: Home / Self Care | Payer: Medicare HMO | Source: Ambulatory Visit | Attending: Specialist | Admitting: Specialist

## 2022-11-24 DIAGNOSIS — G932 Benign intracranial hypertension: Secondary | ICD-10-CM | POA: Diagnosis not present

## 2022-11-24 NOTE — Discharge Instructions (Signed)

## 2022-11-30 DIAGNOSIS — M65312 Trigger thumb, left thumb: Secondary | ICD-10-CM | POA: Diagnosis not present

## 2022-11-30 DIAGNOSIS — M25562 Pain in left knee: Secondary | ICD-10-CM | POA: Diagnosis not present

## 2022-11-30 DIAGNOSIS — M25532 Pain in left wrist: Secondary | ICD-10-CM | POA: Diagnosis not present

## 2022-11-30 DIAGNOSIS — M25561 Pain in right knee: Secondary | ICD-10-CM | POA: Diagnosis not present

## 2022-12-01 LAB — VDRL, CSF: VDRL Quant, CSF: NONREACTIVE

## 2022-12-01 LAB — GLUCOSE, CSF: Glucose, CSF: 72 mg/dL (ref 40–80)

## 2022-12-01 LAB — CSF CELL COUNT WITH DIFFERENTIAL
RBC Count, CSF: 0 cells/uL
TOTAL NUCLEATED CELL: 4 cells/uL (ref 0–5)

## 2022-12-01 LAB — ANGIOTENSIN CONVERTING ENZYME, CSF: ANGIOTENSIN CONVERTING ENZYME ( ACE) CSF: 6 U/L (ref <=15)

## 2022-12-01 LAB — LYME DISEASE ABS IGG, IGM, IFA, CSF
Lyme Disease AB (IgG), IBL: NOT DETECTED
Lyme Disease AB (IgM), IBL: NOT DETECTED

## 2022-12-01 LAB — CRYPTOCOCCAL AG, LTX SCR RFLX TITER: Cryptococcal Ag Screen: NOT DETECTED

## 2022-12-01 LAB — HERPES SIMPLEX VIRUS 1/2 (IGG), CSF
HSV 1 IgG Index:: 0.12
HSV 2 IgG Index:: 0.62

## 2022-12-01 LAB — PROTEIN, CSF: Total Protein, CSF: 45 mg/dL (ref 15–45)

## 2022-12-01 LAB — GRAM STAIN
MICRO NUMBER:: 14729182
SPECIMEN QUALITY:: ADEQUATE

## 2022-12-01 LAB — MYCOBACTERIUM TUBERCULOSIS COMPLEX: MTB Complex,PCR,Non-Resp.: NOT DETECTED

## 2022-12-05 DIAGNOSIS — G518 Other disorders of facial nerve: Secondary | ICD-10-CM | POA: Diagnosis not present

## 2022-12-05 DIAGNOSIS — M542 Cervicalgia: Secondary | ICD-10-CM | POA: Diagnosis not present

## 2022-12-05 DIAGNOSIS — G43719 Chronic migraine without aura, intractable, without status migrainosus: Secondary | ICD-10-CM | POA: Diagnosis not present

## 2022-12-05 DIAGNOSIS — M791 Myalgia, unspecified site: Secondary | ICD-10-CM | POA: Diagnosis not present

## 2023-01-02 DIAGNOSIS — G518 Other disorders of facial nerve: Secondary | ICD-10-CM | POA: Diagnosis not present

## 2023-01-02 DIAGNOSIS — M791 Myalgia, unspecified site: Secondary | ICD-10-CM | POA: Diagnosis not present

## 2023-01-02 DIAGNOSIS — G43719 Chronic migraine without aura, intractable, without status migrainosus: Secondary | ICD-10-CM | POA: Diagnosis not present

## 2023-01-02 DIAGNOSIS — M542 Cervicalgia: Secondary | ICD-10-CM | POA: Diagnosis not present

## 2023-01-10 DIAGNOSIS — F314 Bipolar disorder, current episode depressed, severe, without psychotic features: Secondary | ICD-10-CM | POA: Diagnosis not present

## 2023-01-16 DIAGNOSIS — G518 Other disorders of facial nerve: Secondary | ICD-10-CM | POA: Diagnosis not present

## 2023-01-16 DIAGNOSIS — M791 Myalgia, unspecified site: Secondary | ICD-10-CM | POA: Diagnosis not present

## 2023-01-16 DIAGNOSIS — M542 Cervicalgia: Secondary | ICD-10-CM | POA: Diagnosis not present

## 2023-01-16 DIAGNOSIS — G43719 Chronic migraine without aura, intractable, without status migrainosus: Secondary | ICD-10-CM | POA: Diagnosis not present

## 2023-01-23 DIAGNOSIS — M65312 Trigger thumb, left thumb: Secondary | ICD-10-CM | POA: Diagnosis not present

## 2023-01-23 DIAGNOSIS — M65311 Trigger thumb, right thumb: Secondary | ICD-10-CM | POA: Diagnosis not present

## 2023-01-24 DIAGNOSIS — M65312 Trigger thumb, left thumb: Secondary | ICD-10-CM | POA: Diagnosis not present

## 2023-01-24 DIAGNOSIS — M65341 Trigger finger, right ring finger: Secondary | ICD-10-CM | POA: Diagnosis not present

## 2023-01-24 DIAGNOSIS — M65311 Trigger thumb, right thumb: Secondary | ICD-10-CM | POA: Diagnosis not present

## 2023-01-24 DIAGNOSIS — M65342 Trigger finger, left ring finger: Secondary | ICD-10-CM | POA: Diagnosis not present

## 2023-01-25 DIAGNOSIS — G932 Benign intracranial hypertension: Secondary | ICD-10-CM | POA: Diagnosis not present

## 2023-01-31 DIAGNOSIS — M791 Myalgia, unspecified site: Secondary | ICD-10-CM | POA: Diagnosis not present

## 2023-01-31 DIAGNOSIS — M542 Cervicalgia: Secondary | ICD-10-CM | POA: Diagnosis not present

## 2023-01-31 DIAGNOSIS — G518 Other disorders of facial nerve: Secondary | ICD-10-CM | POA: Diagnosis not present

## 2023-01-31 DIAGNOSIS — G43719 Chronic migraine without aura, intractable, without status migrainosus: Secondary | ICD-10-CM | POA: Diagnosis not present

## 2023-02-07 DIAGNOSIS — Z1231 Encounter for screening mammogram for malignant neoplasm of breast: Secondary | ICD-10-CM | POA: Diagnosis not present

## 2023-02-15 DIAGNOSIS — J4520 Mild intermittent asthma, uncomplicated: Secondary | ICD-10-CM | POA: Diagnosis not present

## 2023-02-15 DIAGNOSIS — E669 Obesity, unspecified: Secondary | ICD-10-CM | POA: Diagnosis not present

## 2023-02-15 DIAGNOSIS — E782 Mixed hyperlipidemia: Secondary | ICD-10-CM | POA: Diagnosis not present

## 2023-02-15 DIAGNOSIS — D508 Other iron deficiency anemias: Secondary | ICD-10-CM | POA: Diagnosis not present

## 2023-02-15 DIAGNOSIS — E559 Vitamin D deficiency, unspecified: Secondary | ICD-10-CM | POA: Diagnosis not present

## 2023-02-15 DIAGNOSIS — Z720 Tobacco use: Secondary | ICD-10-CM | POA: Diagnosis not present

## 2023-02-15 DIAGNOSIS — Z0001 Encounter for general adult medical examination with abnormal findings: Secondary | ICD-10-CM | POA: Diagnosis not present

## 2023-02-15 DIAGNOSIS — E7211 Homocystinuria: Secondary | ICD-10-CM | POA: Diagnosis not present

## 2023-02-15 DIAGNOSIS — Z131 Encounter for screening for diabetes mellitus: Secondary | ICD-10-CM | POA: Diagnosis not present

## 2023-02-19 DIAGNOSIS — M791 Myalgia, unspecified site: Secondary | ICD-10-CM | POA: Diagnosis not present

## 2023-02-19 DIAGNOSIS — G43719 Chronic migraine without aura, intractable, without status migrainosus: Secondary | ICD-10-CM | POA: Diagnosis not present

## 2023-02-19 DIAGNOSIS — M542 Cervicalgia: Secondary | ICD-10-CM | POA: Diagnosis not present

## 2023-02-19 DIAGNOSIS — G518 Other disorders of facial nerve: Secondary | ICD-10-CM | POA: Diagnosis not present

## 2023-02-22 DIAGNOSIS — M65312 Trigger thumb, left thumb: Secondary | ICD-10-CM | POA: Diagnosis not present

## 2023-02-22 DIAGNOSIS — M65342 Trigger finger, left ring finger: Secondary | ICD-10-CM | POA: Diagnosis not present

## 2023-03-15 DIAGNOSIS — E559 Vitamin D deficiency, unspecified: Secondary | ICD-10-CM | POA: Diagnosis not present

## 2023-03-15 DIAGNOSIS — Z720 Tobacco use: Secondary | ICD-10-CM | POA: Diagnosis not present

## 2023-03-15 DIAGNOSIS — E7211 Homocystinuria: Secondary | ICD-10-CM | POA: Diagnosis not present

## 2023-03-15 DIAGNOSIS — J4520 Mild intermittent asthma, uncomplicated: Secondary | ICD-10-CM | POA: Diagnosis not present

## 2023-03-15 DIAGNOSIS — D508 Other iron deficiency anemias: Secondary | ICD-10-CM | POA: Diagnosis not present

## 2023-03-15 DIAGNOSIS — Z0001 Encounter for general adult medical examination with abnormal findings: Secondary | ICD-10-CM | POA: Diagnosis not present

## 2023-03-15 DIAGNOSIS — E782 Mixed hyperlipidemia: Secondary | ICD-10-CM | POA: Diagnosis not present

## 2023-03-19 ENCOUNTER — Inpatient Hospital Stay: Payer: Medicare HMO | Admitting: Family

## 2023-03-19 ENCOUNTER — Inpatient Hospital Stay: Payer: Medicare HMO

## 2023-03-19 DIAGNOSIS — M65341 Trigger finger, right ring finger: Secondary | ICD-10-CM | POA: Diagnosis not present

## 2023-03-29 DIAGNOSIS — M65341 Trigger finger, right ring finger: Secondary | ICD-10-CM | POA: Diagnosis not present

## 2023-03-29 DIAGNOSIS — M65311 Trigger thumb, right thumb: Secondary | ICD-10-CM | POA: Diagnosis not present

## 2023-04-04 DIAGNOSIS — F314 Bipolar disorder, current episode depressed, severe, without psychotic features: Secondary | ICD-10-CM | POA: Diagnosis not present

## 2023-04-04 DIAGNOSIS — M542 Cervicalgia: Secondary | ICD-10-CM | POA: Diagnosis not present

## 2023-04-04 DIAGNOSIS — G518 Other disorders of facial nerve: Secondary | ICD-10-CM | POA: Diagnosis not present

## 2023-04-04 DIAGNOSIS — M791 Myalgia, unspecified site: Secondary | ICD-10-CM | POA: Diagnosis not present

## 2023-04-04 DIAGNOSIS — G43719 Chronic migraine without aura, intractable, without status migrainosus: Secondary | ICD-10-CM | POA: Diagnosis not present

## 2023-04-09 DIAGNOSIS — M65311 Trigger thumb, right thumb: Secondary | ICD-10-CM | POA: Diagnosis not present

## 2023-04-10 ENCOUNTER — Inpatient Hospital Stay: Payer: Medicare HMO | Admitting: Family

## 2023-04-10 ENCOUNTER — Inpatient Hospital Stay: Payer: Medicare HMO

## 2023-05-11 DIAGNOSIS — R079 Chest pain, unspecified: Secondary | ICD-10-CM | POA: Diagnosis not present

## 2023-05-11 DIAGNOSIS — M25461 Effusion, right knee: Secondary | ICD-10-CM | POA: Diagnosis not present

## 2023-05-11 DIAGNOSIS — R0789 Other chest pain: Secondary | ICD-10-CM | POA: Diagnosis not present

## 2023-05-11 DIAGNOSIS — S8391XS Sprain of unspecified site of right knee, sequela: Secondary | ICD-10-CM | POA: Diagnosis not present

## 2023-05-11 DIAGNOSIS — Z87891 Personal history of nicotine dependence: Secondary | ICD-10-CM | POA: Diagnosis not present

## 2023-05-11 DIAGNOSIS — W06XXXA Fall from bed, initial encounter: Secondary | ICD-10-CM | POA: Diagnosis not present

## 2023-05-11 DIAGNOSIS — G43909 Migraine, unspecified, not intractable, without status migrainosus: Secondary | ICD-10-CM | POA: Diagnosis not present

## 2023-05-11 DIAGNOSIS — S86911A Strain of unspecified muscle(s) and tendon(s) at lower leg level, right leg, initial encounter: Secondary | ICD-10-CM | POA: Diagnosis not present

## 2023-05-15 DIAGNOSIS — M1711 Unilateral primary osteoarthritis, right knee: Secondary | ICD-10-CM | POA: Diagnosis not present

## 2023-05-15 DIAGNOSIS — M25561 Pain in right knee: Secondary | ICD-10-CM | POA: Diagnosis not present

## 2023-05-15 DIAGNOSIS — M545 Low back pain, unspecified: Secondary | ICD-10-CM | POA: Diagnosis not present

## 2023-05-28 DIAGNOSIS — E7211 Homocystinuria: Secondary | ICD-10-CM | POA: Diagnosis not present

## 2023-05-28 DIAGNOSIS — E782 Mixed hyperlipidemia: Secondary | ICD-10-CM | POA: Diagnosis not present

## 2023-05-28 DIAGNOSIS — J4520 Mild intermittent asthma, uncomplicated: Secondary | ICD-10-CM | POA: Diagnosis not present

## 2023-05-28 DIAGNOSIS — D508 Other iron deficiency anemias: Secondary | ICD-10-CM | POA: Diagnosis not present

## 2023-05-28 DIAGNOSIS — I10 Essential (primary) hypertension: Secondary | ICD-10-CM | POA: Diagnosis not present

## 2023-05-28 DIAGNOSIS — E559 Vitamin D deficiency, unspecified: Secondary | ICD-10-CM | POA: Diagnosis not present

## 2023-05-28 DIAGNOSIS — Z720 Tobacco use: Secondary | ICD-10-CM | POA: Diagnosis not present

## 2023-05-29 DIAGNOSIS — M2569 Stiffness of other specified joint, not elsewhere classified: Secondary | ICD-10-CM | POA: Diagnosis not present

## 2023-05-29 DIAGNOSIS — M6281 Muscle weakness (generalized): Secondary | ICD-10-CM | POA: Diagnosis not present

## 2023-05-29 DIAGNOSIS — M5451 Vertebrogenic low back pain: Secondary | ICD-10-CM | POA: Diagnosis not present

## 2023-05-29 DIAGNOSIS — M25561 Pain in right knee: Secondary | ICD-10-CM | POA: Diagnosis not present

## 2023-06-01 ENCOUNTER — Other Ambulatory Visit: Payer: Self-pay | Admitting: Gastroenterology

## 2023-06-01 DIAGNOSIS — R1310 Dysphagia, unspecified: Secondary | ICD-10-CM

## 2023-06-01 DIAGNOSIS — R109 Unspecified abdominal pain: Secondary | ICD-10-CM | POA: Diagnosis not present

## 2023-06-01 DIAGNOSIS — Z86010 Personal history of colonic polyps: Secondary | ICD-10-CM | POA: Diagnosis not present

## 2023-06-04 DIAGNOSIS — M5451 Vertebrogenic low back pain: Secondary | ICD-10-CM | POA: Diagnosis not present

## 2023-06-04 DIAGNOSIS — M2569 Stiffness of other specified joint, not elsewhere classified: Secondary | ICD-10-CM | POA: Diagnosis not present

## 2023-06-04 DIAGNOSIS — M6281 Muscle weakness (generalized): Secondary | ICD-10-CM | POA: Diagnosis not present

## 2023-06-04 DIAGNOSIS — M25561 Pain in right knee: Secondary | ICD-10-CM | POA: Diagnosis not present

## 2023-06-07 DIAGNOSIS — M2569 Stiffness of other specified joint, not elsewhere classified: Secondary | ICD-10-CM | POA: Diagnosis not present

## 2023-06-07 DIAGNOSIS — M6281 Muscle weakness (generalized): Secondary | ICD-10-CM | POA: Diagnosis not present

## 2023-06-07 DIAGNOSIS — M25561 Pain in right knee: Secondary | ICD-10-CM | POA: Diagnosis not present

## 2023-06-07 DIAGNOSIS — M5451 Vertebrogenic low back pain: Secondary | ICD-10-CM | POA: Diagnosis not present

## 2023-06-11 ENCOUNTER — Encounter: Payer: Self-pay | Admitting: Family

## 2023-06-11 ENCOUNTER — Ambulatory Visit
Admission: RE | Admit: 2023-06-11 | Discharge: 2023-06-11 | Disposition: A | Discharge status: Home / Self Care | Payer: Medicare HMO | Source: Ambulatory Visit | Attending: Gastroenterology | Admitting: Gastroenterology

## 2023-06-11 DIAGNOSIS — R1310 Dysphagia, unspecified: Secondary | ICD-10-CM

## 2023-06-11 DIAGNOSIS — K219 Gastro-esophageal reflux disease without esophagitis: Secondary | ICD-10-CM | POA: Diagnosis not present

## 2023-06-11 DIAGNOSIS — M25561 Pain in right knee: Secondary | ICD-10-CM | POA: Diagnosis not present

## 2023-06-11 DIAGNOSIS — K449 Diaphragmatic hernia without obstruction or gangrene: Secondary | ICD-10-CM | POA: Diagnosis not present

## 2023-06-18 DIAGNOSIS — E782 Mixed hyperlipidemia: Secondary | ICD-10-CM | POA: Diagnosis not present

## 2023-06-18 DIAGNOSIS — M1990 Unspecified osteoarthritis, unspecified site: Secondary | ICD-10-CM | POA: Diagnosis not present

## 2023-06-18 DIAGNOSIS — J4520 Mild intermittent asthma, uncomplicated: Secondary | ICD-10-CM | POA: Diagnosis not present

## 2023-06-18 DIAGNOSIS — E7211 Homocystinuria: Secondary | ICD-10-CM | POA: Diagnosis not present

## 2023-06-18 DIAGNOSIS — E559 Vitamin D deficiency, unspecified: Secondary | ICD-10-CM | POA: Diagnosis not present

## 2023-06-18 DIAGNOSIS — Z131 Encounter for screening for diabetes mellitus: Secondary | ICD-10-CM | POA: Diagnosis not present

## 2023-06-18 DIAGNOSIS — I252 Old myocardial infarction: Secondary | ICD-10-CM | POA: Diagnosis not present

## 2023-06-18 DIAGNOSIS — R7989 Other specified abnormal findings of blood chemistry: Secondary | ICD-10-CM | POA: Diagnosis not present

## 2023-06-18 DIAGNOSIS — E860 Dehydration: Secondary | ICD-10-CM | POA: Diagnosis not present

## 2023-06-18 DIAGNOSIS — R531 Weakness: Secondary | ICD-10-CM | POA: Diagnosis not present

## 2023-06-18 DIAGNOSIS — I10 Essential (primary) hypertension: Secondary | ICD-10-CM | POA: Diagnosis not present

## 2023-06-18 DIAGNOSIS — D508 Other iron deficiency anemias: Secondary | ICD-10-CM | POA: Diagnosis not present

## 2023-06-18 DIAGNOSIS — G733 Myasthenic syndromes in other diseases classified elsewhere: Secondary | ICD-10-CM | POA: Diagnosis not present

## 2023-06-18 DIAGNOSIS — R944 Abnormal results of kidney function studies: Secondary | ICD-10-CM | POA: Diagnosis not present

## 2023-06-18 DIAGNOSIS — I2510 Atherosclerotic heart disease of native coronary artery without angina pectoris: Secondary | ICD-10-CM | POA: Diagnosis not present

## 2023-06-18 DIAGNOSIS — E876 Hypokalemia: Secondary | ICD-10-CM | POA: Diagnosis not present

## 2023-06-18 DIAGNOSIS — Z720 Tobacco use: Secondary | ICD-10-CM | POA: Diagnosis not present

## 2023-06-28 DIAGNOSIS — M25561 Pain in right knee: Secondary | ICD-10-CM | POA: Diagnosis not present

## 2023-07-02 DIAGNOSIS — N179 Acute kidney failure, unspecified: Secondary | ICD-10-CM | POA: Diagnosis not present

## 2023-07-02 DIAGNOSIS — E7211 Homocystinuria: Secondary | ICD-10-CM | POA: Diagnosis not present

## 2023-07-04 DIAGNOSIS — M1711 Unilateral primary osteoarthritis, right knee: Secondary | ICD-10-CM | POA: Diagnosis not present

## 2023-07-16 DIAGNOSIS — J4520 Mild intermittent asthma, uncomplicated: Secondary | ICD-10-CM | POA: Diagnosis not present

## 2023-07-16 DIAGNOSIS — E559 Vitamin D deficiency, unspecified: Secondary | ICD-10-CM | POA: Diagnosis not present

## 2023-07-16 DIAGNOSIS — Z131 Encounter for screening for diabetes mellitus: Secondary | ICD-10-CM | POA: Diagnosis not present

## 2023-07-16 DIAGNOSIS — D508 Other iron deficiency anemias: Secondary | ICD-10-CM | POA: Diagnosis not present

## 2023-07-16 DIAGNOSIS — E7211 Homocystinuria: Secondary | ICD-10-CM | POA: Diagnosis not present

## 2023-07-16 DIAGNOSIS — Z720 Tobacco use: Secondary | ICD-10-CM | POA: Diagnosis not present

## 2023-07-16 DIAGNOSIS — I10 Essential (primary) hypertension: Secondary | ICD-10-CM | POA: Diagnosis not present

## 2023-07-16 DIAGNOSIS — E782 Mixed hyperlipidemia: Secondary | ICD-10-CM | POA: Diagnosis not present

## 2023-07-19 DIAGNOSIS — E782 Mixed hyperlipidemia: Secondary | ICD-10-CM | POA: Diagnosis not present

## 2023-07-19 DIAGNOSIS — E669 Obesity, unspecified: Secondary | ICD-10-CM | POA: Diagnosis not present

## 2023-07-19 DIAGNOSIS — R072 Precordial pain: Secondary | ICD-10-CM | POA: Diagnosis not present

## 2023-07-19 DIAGNOSIS — I10 Essential (primary) hypertension: Secondary | ICD-10-CM | POA: Diagnosis not present

## 2023-07-27 DIAGNOSIS — R072 Precordial pain: Secondary | ICD-10-CM | POA: Diagnosis not present

## 2023-07-27 DIAGNOSIS — E669 Obesity, unspecified: Secondary | ICD-10-CM | POA: Diagnosis not present

## 2023-07-27 DIAGNOSIS — E782 Mixed hyperlipidemia: Secondary | ICD-10-CM | POA: Diagnosis not present

## 2023-07-27 DIAGNOSIS — I10 Essential (primary) hypertension: Secondary | ICD-10-CM | POA: Diagnosis not present

## 2023-08-08 DIAGNOSIS — F314 Bipolar disorder, current episode depressed, severe, without psychotic features: Secondary | ICD-10-CM | POA: Diagnosis not present

## 2023-08-20 DIAGNOSIS — J4520 Mild intermittent asthma, uncomplicated: Secondary | ICD-10-CM | POA: Diagnosis not present

## 2023-08-20 DIAGNOSIS — E559 Vitamin D deficiency, unspecified: Secondary | ICD-10-CM | POA: Diagnosis not present

## 2023-08-20 DIAGNOSIS — D508 Other iron deficiency anemias: Secondary | ICD-10-CM | POA: Diagnosis not present

## 2023-08-20 DIAGNOSIS — Z720 Tobacco use: Secondary | ICD-10-CM | POA: Diagnosis not present

## 2023-08-20 DIAGNOSIS — E782 Mixed hyperlipidemia: Secondary | ICD-10-CM | POA: Diagnosis not present

## 2023-08-20 DIAGNOSIS — E7211 Homocystinuria: Secondary | ICD-10-CM | POA: Diagnosis not present

## 2023-08-20 DIAGNOSIS — I10 Essential (primary) hypertension: Secondary | ICD-10-CM | POA: Diagnosis not present

## 2023-09-07 DIAGNOSIS — R072 Precordial pain: Secondary | ICD-10-CM | POA: Diagnosis not present

## 2023-09-07 DIAGNOSIS — E669 Obesity, unspecified: Secondary | ICD-10-CM | POA: Diagnosis not present

## 2023-09-07 DIAGNOSIS — I10 Essential (primary) hypertension: Secondary | ICD-10-CM | POA: Diagnosis not present

## 2023-09-07 DIAGNOSIS — E782 Mixed hyperlipidemia: Secondary | ICD-10-CM | POA: Diagnosis not present

## 2023-09-19 DIAGNOSIS — M25551 Pain in right hip: Secondary | ICD-10-CM | POA: Diagnosis not present

## 2023-09-19 DIAGNOSIS — M545 Low back pain, unspecified: Secondary | ICD-10-CM | POA: Diagnosis not present

## 2023-10-05 DIAGNOSIS — M545 Low back pain, unspecified: Secondary | ICD-10-CM | POA: Diagnosis not present

## 2023-10-15 DIAGNOSIS — M545 Low back pain, unspecified: Secondary | ICD-10-CM | POA: Diagnosis not present

## 2023-10-22 DIAGNOSIS — M47816 Spondylosis without myelopathy or radiculopathy, lumbar region: Secondary | ICD-10-CM | POA: Diagnosis not present

## 2023-10-29 DIAGNOSIS — E237 Disorder of pituitary gland, unspecified: Secondary | ICD-10-CM | POA: Diagnosis not present

## 2023-10-29 DIAGNOSIS — D320 Benign neoplasm of cerebral meninges: Secondary | ICD-10-CM | POA: Diagnosis not present

## 2023-10-29 DIAGNOSIS — G4459 Other complicated headache syndrome: Secondary | ICD-10-CM | POA: Diagnosis not present

## 2023-11-14 DIAGNOSIS — F314 Bipolar disorder, current episode depressed, severe, without psychotic features: Secondary | ICD-10-CM | POA: Diagnosis not present

## 2023-11-19 DIAGNOSIS — G932 Benign intracranial hypertension: Secondary | ICD-10-CM | POA: Diagnosis not present

## 2023-11-19 DIAGNOSIS — D329 Benign neoplasm of meninges, unspecified: Secondary | ICD-10-CM | POA: Diagnosis not present

## 2023-11-19 DIAGNOSIS — R9082 White matter disease, unspecified: Secondary | ICD-10-CM | POA: Diagnosis not present

## 2023-11-28 DIAGNOSIS — E237 Disorder of pituitary gland, unspecified: Secondary | ICD-10-CM | POA: Diagnosis not present

## 2023-11-28 DIAGNOSIS — D320 Benign neoplasm of cerebral meninges: Secondary | ICD-10-CM | POA: Diagnosis not present

## 2023-11-28 DIAGNOSIS — G4459 Other complicated headache syndrome: Secondary | ICD-10-CM | POA: Diagnosis not present

## 2023-11-29 DIAGNOSIS — M47816 Spondylosis without myelopathy or radiculopathy, lumbar region: Secondary | ICD-10-CM | POA: Diagnosis not present

## 2023-11-29 DIAGNOSIS — M545 Low back pain, unspecified: Secondary | ICD-10-CM | POA: Diagnosis not present

## 2023-12-10 DIAGNOSIS — J4520 Mild intermittent asthma, uncomplicated: Secondary | ICD-10-CM | POA: Diagnosis not present

## 2023-12-10 DIAGNOSIS — E782 Mixed hyperlipidemia: Secondary | ICD-10-CM | POA: Diagnosis not present

## 2023-12-10 DIAGNOSIS — E559 Vitamin D deficiency, unspecified: Secondary | ICD-10-CM | POA: Diagnosis not present

## 2023-12-10 DIAGNOSIS — Z0001 Encounter for general adult medical examination with abnormal findings: Secondary | ICD-10-CM | POA: Diagnosis not present

## 2023-12-10 DIAGNOSIS — Z131 Encounter for screening for diabetes mellitus: Secondary | ICD-10-CM | POA: Diagnosis not present

## 2023-12-10 DIAGNOSIS — D508 Other iron deficiency anemias: Secondary | ICD-10-CM | POA: Diagnosis not present

## 2023-12-10 DIAGNOSIS — G4733 Obstructive sleep apnea (adult) (pediatric): Secondary | ICD-10-CM | POA: Diagnosis not present

## 2023-12-10 DIAGNOSIS — E7211 Homocystinuria: Secondary | ICD-10-CM | POA: Diagnosis not present

## 2023-12-10 DIAGNOSIS — Z720 Tobacco use: Secondary | ICD-10-CM | POA: Diagnosis not present

## 2023-12-10 DIAGNOSIS — I10 Essential (primary) hypertension: Secondary | ICD-10-CM | POA: Diagnosis not present

## 2023-12-11 DIAGNOSIS — K219 Gastro-esophageal reflux disease without esophagitis: Secondary | ICD-10-CM | POA: Diagnosis not present

## 2023-12-11 DIAGNOSIS — Z860101 Personal history of adenomatous and serrated colon polyps: Secondary | ICD-10-CM | POA: Diagnosis not present

## 2023-12-14 DIAGNOSIS — E782 Mixed hyperlipidemia: Secondary | ICD-10-CM | POA: Diagnosis not present

## 2023-12-14 DIAGNOSIS — I10 Essential (primary) hypertension: Secondary | ICD-10-CM | POA: Diagnosis not present

## 2023-12-14 DIAGNOSIS — E669 Obesity, unspecified: Secondary | ICD-10-CM | POA: Diagnosis not present

## 2023-12-24 DIAGNOSIS — E7211 Homocystinuria: Secondary | ICD-10-CM | POA: Diagnosis not present

## 2023-12-24 DIAGNOSIS — E782 Mixed hyperlipidemia: Secondary | ICD-10-CM | POA: Diagnosis not present

## 2023-12-24 DIAGNOSIS — I10 Essential (primary) hypertension: Secondary | ICD-10-CM | POA: Diagnosis not present

## 2023-12-24 DIAGNOSIS — Z720 Tobacco use: Secondary | ICD-10-CM | POA: Diagnosis not present

## 2023-12-24 DIAGNOSIS — E559 Vitamin D deficiency, unspecified: Secondary | ICD-10-CM | POA: Diagnosis not present

## 2023-12-24 DIAGNOSIS — D508 Other iron deficiency anemias: Secondary | ICD-10-CM | POA: Diagnosis not present

## 2023-12-24 DIAGNOSIS — G4733 Obstructive sleep apnea (adult) (pediatric): Secondary | ICD-10-CM | POA: Diagnosis not present

## 2023-12-24 DIAGNOSIS — J4520 Mild intermittent asthma, uncomplicated: Secondary | ICD-10-CM | POA: Diagnosis not present

## 2024-01-17 DIAGNOSIS — R400 Somnolence: Secondary | ICD-10-CM | POA: Diagnosis not present

## 2024-01-17 DIAGNOSIS — G4733 Obstructive sleep apnea (adult) (pediatric): Secondary | ICD-10-CM | POA: Diagnosis not present

## 2024-01-17 DIAGNOSIS — Z6841 Body Mass Index (BMI) 40.0 and over, adult: Secondary | ICD-10-CM | POA: Diagnosis not present

## 2024-01-17 DIAGNOSIS — R0683 Snoring: Secondary | ICD-10-CM | POA: Diagnosis not present

## 2024-02-05 DIAGNOSIS — J329 Chronic sinusitis, unspecified: Secondary | ICD-10-CM | POA: Diagnosis not present

## 2024-02-05 DIAGNOSIS — J341 Cyst and mucocele of nose and nasal sinus: Secondary | ICD-10-CM | POA: Diagnosis not present

## 2024-02-05 DIAGNOSIS — K1120 Sialoadenitis, unspecified: Secondary | ICD-10-CM | POA: Diagnosis not present

## 2024-02-10 DIAGNOSIS — G4733 Obstructive sleep apnea (adult) (pediatric): Secondary | ICD-10-CM | POA: Diagnosis not present

## 2024-02-12 DIAGNOSIS — G4733 Obstructive sleep apnea (adult) (pediatric): Secondary | ICD-10-CM | POA: Diagnosis not present

## 2024-02-13 DIAGNOSIS — Z1231 Encounter for screening mammogram for malignant neoplasm of breast: Secondary | ICD-10-CM | POA: Diagnosis not present

## 2024-02-19 DIAGNOSIS — F314 Bipolar disorder, current episode depressed, severe, without psychotic features: Secondary | ICD-10-CM | POA: Diagnosis not present

## 2024-02-22 DIAGNOSIS — G4733 Obstructive sleep apnea (adult) (pediatric): Secondary | ICD-10-CM | POA: Diagnosis not present

## 2024-03-04 DIAGNOSIS — J341 Cyst and mucocele of nose and nasal sinus: Secondary | ICD-10-CM | POA: Diagnosis not present

## 2024-03-04 DIAGNOSIS — J329 Chronic sinusitis, unspecified: Secondary | ICD-10-CM | POA: Diagnosis not present

## 2024-03-17 DIAGNOSIS — E782 Mixed hyperlipidemia: Secondary | ICD-10-CM | POA: Diagnosis not present

## 2024-03-17 DIAGNOSIS — E559 Vitamin D deficiency, unspecified: Secondary | ICD-10-CM | POA: Diagnosis not present

## 2024-03-17 DIAGNOSIS — E7211 Homocystinuria: Secondary | ICD-10-CM | POA: Diagnosis not present

## 2024-03-17 DIAGNOSIS — I10 Essential (primary) hypertension: Secondary | ICD-10-CM | POA: Diagnosis not present

## 2024-03-17 DIAGNOSIS — B356 Tinea cruris: Secondary | ICD-10-CM | POA: Diagnosis not present

## 2024-03-17 DIAGNOSIS — D508 Other iron deficiency anemias: Secondary | ICD-10-CM | POA: Diagnosis not present

## 2024-03-17 DIAGNOSIS — Z720 Tobacco use: Secondary | ICD-10-CM | POA: Diagnosis not present

## 2024-03-17 DIAGNOSIS — J4520 Mild intermittent asthma, uncomplicated: Secondary | ICD-10-CM | POA: Diagnosis not present

## 2024-03-17 DIAGNOSIS — G4733 Obstructive sleep apnea (adult) (pediatric): Secondary | ICD-10-CM | POA: Diagnosis not present

## 2024-04-08 DIAGNOSIS — Z79899 Other long term (current) drug therapy: Secondary | ICD-10-CM | POA: Diagnosis not present

## 2024-04-08 DIAGNOSIS — M1990 Unspecified osteoarthritis, unspecified site: Secondary | ICD-10-CM | POA: Diagnosis not present

## 2024-04-08 DIAGNOSIS — I252 Old myocardial infarction: Secondary | ICD-10-CM | POA: Diagnosis not present

## 2024-04-08 DIAGNOSIS — G4733 Obstructive sleep apnea (adult) (pediatric): Secondary | ICD-10-CM | POA: Diagnosis not present

## 2024-04-08 DIAGNOSIS — R531 Weakness: Secondary | ICD-10-CM | POA: Diagnosis not present

## 2024-04-08 DIAGNOSIS — I2510 Atherosclerotic heart disease of native coronary artery without angina pectoris: Secondary | ICD-10-CM | POA: Diagnosis not present

## 2024-04-08 DIAGNOSIS — G43909 Migraine, unspecified, not intractable, without status migrainosus: Secondary | ICD-10-CM | POA: Diagnosis not present

## 2024-04-08 DIAGNOSIS — Z87891 Personal history of nicotine dependence: Secondary | ICD-10-CM | POA: Diagnosis not present

## 2024-04-08 DIAGNOSIS — R519 Headache, unspecified: Secondary | ICD-10-CM | POA: Diagnosis not present

## 2024-04-08 DIAGNOSIS — Z8673 Personal history of transient ischemic attack (TIA), and cerebral infarction without residual deficits: Secondary | ICD-10-CM | POA: Diagnosis not present

## 2024-04-08 DIAGNOSIS — R079 Chest pain, unspecified: Secondary | ICD-10-CM | POA: Diagnosis not present

## 2024-04-08 DIAGNOSIS — G43009 Migraine without aura, not intractable, without status migrainosus: Secondary | ICD-10-CM | POA: Diagnosis not present

## 2024-04-08 DIAGNOSIS — R059 Cough, unspecified: Secondary | ICD-10-CM | POA: Diagnosis not present

## 2024-04-08 DIAGNOSIS — R29818 Other symptoms and signs involving the nervous system: Secondary | ICD-10-CM | POA: Diagnosis not present

## 2024-04-08 DIAGNOSIS — R200 Anesthesia of skin: Secondary | ICD-10-CM | POA: Diagnosis not present

## 2024-04-14 DIAGNOSIS — G932 Benign intracranial hypertension: Secondary | ICD-10-CM | POA: Diagnosis not present

## 2024-04-14 DIAGNOSIS — G4459 Other complicated headache syndrome: Secondary | ICD-10-CM | POA: Diagnosis not present

## 2024-04-14 DIAGNOSIS — D320 Benign neoplasm of cerebral meninges: Secondary | ICD-10-CM | POA: Diagnosis not present

## 2024-04-14 DIAGNOSIS — I10 Essential (primary) hypertension: Secondary | ICD-10-CM | POA: Diagnosis not present

## 2024-05-19 DIAGNOSIS — I10 Essential (primary) hypertension: Secondary | ICD-10-CM | POA: Diagnosis not present

## 2024-05-19 DIAGNOSIS — D508 Other iron deficiency anemias: Secondary | ICD-10-CM | POA: Diagnosis not present

## 2024-05-19 DIAGNOSIS — Z720 Tobacco use: Secondary | ICD-10-CM | POA: Diagnosis not present

## 2024-05-19 DIAGNOSIS — Z131 Encounter for screening for diabetes mellitus: Secondary | ICD-10-CM | POA: Diagnosis not present

## 2024-05-19 DIAGNOSIS — E782 Mixed hyperlipidemia: Secondary | ICD-10-CM | POA: Diagnosis not present

## 2024-05-19 DIAGNOSIS — J4520 Mild intermittent asthma, uncomplicated: Secondary | ICD-10-CM | POA: Diagnosis not present

## 2024-05-19 DIAGNOSIS — E7211 Homocystinuria: Secondary | ICD-10-CM | POA: Diagnosis not present

## 2024-05-19 DIAGNOSIS — E559 Vitamin D deficiency, unspecified: Secondary | ICD-10-CM | POA: Diagnosis not present

## 2024-05-19 DIAGNOSIS — G4733 Obstructive sleep apnea (adult) (pediatric): Secondary | ICD-10-CM | POA: Diagnosis not present

## 2024-05-29 DIAGNOSIS — D320 Benign neoplasm of cerebral meninges: Secondary | ICD-10-CM | POA: Diagnosis not present

## 2024-05-29 DIAGNOSIS — G4459 Other complicated headache syndrome: Secondary | ICD-10-CM | POA: Diagnosis not present

## 2024-05-29 DIAGNOSIS — G932 Benign intracranial hypertension: Secondary | ICD-10-CM | POA: Diagnosis not present

## 2024-05-30 DIAGNOSIS — F314 Bipolar disorder, current episode depressed, severe, without psychotic features: Secondary | ICD-10-CM | POA: Diagnosis not present

## 2024-06-10 DIAGNOSIS — K219 Gastro-esophageal reflux disease without esophagitis: Secondary | ICD-10-CM | POA: Diagnosis not present

## 2024-06-10 DIAGNOSIS — R1310 Dysphagia, unspecified: Secondary | ICD-10-CM | POA: Diagnosis not present

## 2024-06-23 DIAGNOSIS — I10 Essential (primary) hypertension: Secondary | ICD-10-CM | POA: Diagnosis not present

## 2024-06-23 DIAGNOSIS — Z1330 Encounter for screening examination for mental health and behavioral disorders, unspecified: Secondary | ICD-10-CM | POA: Diagnosis not present

## 2024-06-23 DIAGNOSIS — G4733 Obstructive sleep apnea (adult) (pediatric): Secondary | ICD-10-CM | POA: Diagnosis not present

## 2024-07-01 DIAGNOSIS — D320 Benign neoplasm of cerebral meninges: Secondary | ICD-10-CM | POA: Diagnosis not present

## 2024-07-01 DIAGNOSIS — G932 Benign intracranial hypertension: Secondary | ICD-10-CM | POA: Diagnosis not present

## 2024-07-21 DIAGNOSIS — K222 Esophageal obstruction: Secondary | ICD-10-CM | POA: Diagnosis not present

## 2024-07-21 DIAGNOSIS — R1310 Dysphagia, unspecified: Secondary | ICD-10-CM | POA: Diagnosis not present

## 2024-07-21 DIAGNOSIS — Z9884 Bariatric surgery status: Secondary | ICD-10-CM | POA: Diagnosis not present

## 2024-07-21 DIAGNOSIS — R1013 Epigastric pain: Secondary | ICD-10-CM | POA: Diagnosis not present
